# Patient Record
Sex: Male | Born: 1950 | Race: White | Hispanic: No | Marital: Married | Smoking: Former smoker
Health system: Southern US, Community
[De-identification: ages and names within clinical notes are randomized; demographics above are authoritative.]

## PROBLEM LIST (undated history)

## (undated) DIAGNOSIS — I10 Essential (primary) hypertension: Secondary | ICD-10-CM

## (undated) DIAGNOSIS — I341 Nonrheumatic mitral (valve) prolapse: Secondary | ICD-10-CM

## (undated) DIAGNOSIS — I5032 Chronic diastolic (congestive) heart failure: Secondary | ICD-10-CM

## (undated) DIAGNOSIS — Z9889 Other specified postprocedural states: Secondary | ICD-10-CM

## (undated) DIAGNOSIS — I340 Nonrheumatic mitral (valve) insufficiency: Secondary | ICD-10-CM

## (undated) DIAGNOSIS — R011 Cardiac murmur, unspecified: Secondary | ICD-10-CM

## (undated) DIAGNOSIS — M1990 Unspecified osteoarthritis, unspecified site: Secondary | ICD-10-CM

## (undated) HISTORY — DX: Nonrheumatic mitral (valve) prolapse: I34.1

## (undated) HISTORY — DX: Unspecified osteoarthritis, unspecified site: M19.90

## (undated) HISTORY — DX: Essential (primary) hypertension: I10

## (undated) HISTORY — DX: Nonrheumatic mitral (valve) insufficiency: I34.0

## (undated) HISTORY — DX: Cardiac murmur, unspecified: R01.1

## (undated) HISTORY — PX: NEPHRECTOMY: SHX65

---

## 2017-04-11 DIAGNOSIS — I10 Essential (primary) hypertension: Secondary | ICD-10-CM | POA: Diagnosis not present

## 2017-04-11 DIAGNOSIS — I340 Nonrheumatic mitral (valve) insufficiency: Secondary | ICD-10-CM | POA: Diagnosis not present

## 2017-04-11 DIAGNOSIS — I341 Nonrheumatic mitral (valve) prolapse: Secondary | ICD-10-CM | POA: Diagnosis not present

## 2017-04-26 DIAGNOSIS — Z79899 Other long term (current) drug therapy: Secondary | ICD-10-CM | POA: Diagnosis not present

## 2017-04-26 DIAGNOSIS — I10 Essential (primary) hypertension: Secondary | ICD-10-CM | POA: Diagnosis not present

## 2017-05-03 DIAGNOSIS — I341 Nonrheumatic mitral (valve) prolapse: Secondary | ICD-10-CM | POA: Diagnosis not present

## 2017-06-13 DIAGNOSIS — I509 Heart failure, unspecified: Secondary | ICD-10-CM | POA: Diagnosis not present

## 2017-06-13 DIAGNOSIS — I080 Rheumatic disorders of both mitral and aortic valves: Secondary | ICD-10-CM | POA: Diagnosis not present

## 2017-06-13 DIAGNOSIS — Z87891 Personal history of nicotine dependence: Secondary | ICD-10-CM | POA: Diagnosis not present

## 2017-06-13 DIAGNOSIS — R0602 Shortness of breath: Secondary | ICD-10-CM | POA: Diagnosis not present

## 2017-06-13 DIAGNOSIS — I340 Nonrheumatic mitral (valve) insufficiency: Secondary | ICD-10-CM | POA: Diagnosis not present

## 2017-06-13 DIAGNOSIS — I341 Nonrheumatic mitral (valve) prolapse: Secondary | ICD-10-CM | POA: Diagnosis not present

## 2017-06-13 DIAGNOSIS — I110 Hypertensive heart disease with heart failure: Secondary | ICD-10-CM | POA: Diagnosis not present

## 2017-07-03 DIAGNOSIS — Z1159 Encounter for screening for other viral diseases: Secondary | ICD-10-CM | POA: Diagnosis not present

## 2017-07-03 DIAGNOSIS — R748 Abnormal levels of other serum enzymes: Secondary | ICD-10-CM | POA: Diagnosis not present

## 2017-07-08 ENCOUNTER — Encounter: Payer: Self-pay | Admitting: Thoracic Surgery (Cardiothoracic Vascular Surgery)

## 2017-07-08 ENCOUNTER — Other Ambulatory Visit: Payer: Self-pay

## 2017-07-08 ENCOUNTER — Institutional Professional Consult (permissible substitution) (INDEPENDENT_AMBULATORY_CARE_PROVIDER_SITE_OTHER): Payer: PPO | Admitting: Thoracic Surgery (Cardiothoracic Vascular Surgery)

## 2017-07-08 VITALS — BP 148/96 | Resp 16 | Ht 67.0 in | Wt 230.0 lb

## 2017-07-08 DIAGNOSIS — I340 Nonrheumatic mitral (valve) insufficiency: Secondary | ICD-10-CM | POA: Insufficient documentation

## 2017-07-08 DIAGNOSIS — I341 Nonrheumatic mitral (valve) prolapse: Secondary | ICD-10-CM

## 2017-07-08 NOTE — Patient Instructions (Signed)
Continue all previous medications without any changes at this time  

## 2017-07-08 NOTE — Progress Notes (Signed)
[address]       Craig Griffin [address]             [phone]     CARDIOTHORACIC SURGERY CONSULTATION REPORT  Referring Provider is Philemon Kingdom, MD PCP is Philemon Kingdom, MD  Chief Complaint  Patient presents with  . Mitral Valve Prolapse    Surgical eval, ECHO 06/13/17 @ [facility], Cardiac Cath   . Mitral Regurgitation    HPI:  Patient is a 66 year old male with history of hypertension and recently discovered mitral valve prolapse with severe symptomatic primary mitral regurgitation who has been referred for second surgical opinion. The patient states that he has been treated for hypertension for many years. He has remained physically active and without significant limitations until fairly recently. Approximately one year ago he began to experience symptoms of exertional shortness of breath and fatigue. He was noted to have a heart murmur on routine physical exam by his primary care physician and transthoracic echocardiogram revealed mitral valve prolapse with severe mitral regurgitation and normal left ventricular systolic function. The patient was evaluated by Dr. Tomie China and referred to Dr. Silvestre Mesi at [facility] for surgical consultation.  The patient was scheduled for elective outpatient cardiac catheterization and surgery, but the patient was subsequently informed that [facility] did not accept the patient's healthcare insurance. The patient was subsequently referred for elective second opinion consultation.  The patient is married and lives with his wife in [place]. He is originally from [place] and relocated to [place] approximately one year ago following his retirement. 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 This does not require any strenuous activity but it does require a lot of  walking. The patient states that he first began to experience symptoms of exertional shortness of breath more than he year ago when he was still working in [place]. He notices symptoms when the weather is hot. He states that he does not experience shortness of breath with ordinary activities and his physical limitations are not limited to a significant degree. His wife complains that he gets short of breath and is "huffing and puffing" much more easily than he used to.  He denies any history of exertional chest pain or chest tightness. He has not experienced any resting shortness of breath, PND, orthopnea, or lower extremity edema. He reports occasional dizzy spells without syncope.  Past Medical History:  Diagnosis Date  . Heart murmur   . HTN (hypertension)   . Mitral regurgitation due to cusp prolapse   . Mitral valve prolapse   . Osteoarthritis   . Severe mitral regurgitation     Past Surgical History:  Procedure Laterality Date  . NEPHRECTOMY      Family History  Problem Relation Age of Onset  . Melanoma Father   . Liver cancer Mother   . Hypertension Mother   . Brain cancer Brother     Social History   Social History  . Marital status: Married    Spouse name: carole  . Number of children: 1  . Years of education: N/A   Occupational History  . superintendent    Social History Main Topics  . Smoking status: Former Smoker    Years: 10.00    Types: Cigars    Quit date: 12/24/1996  . Smokeless tobacco: Never Used  . Alcohol  use Yes  . Drug use: No  . Sexual activity: Not on file   Other Topics Concern  . Not on file   Social History Narrative  . No narrative on file    Current Outpatient Prescriptions  Medication Sig Dispense Refill  . amoxicillin (AMOXIL) 500 MG capsule Take 2,000 mg by mouth. 4 capsules...1 hr before any dental procedures    . olmesartan (BENICAR) 40 MG tablet Take 40 mg by mouth daily.      No current facility-administered medications  for this visit.     No Known Allergies    Review of Systems:   General:  normal appetite, normal energy, no weight gain, no weight loss, no fever  Cardiac:  no chest pain with exertion, no chest pain at rest, + SOB with exertion, no resting SOB, no PND, no orthopnea, no palpitations, no arrhythmia, no atrial fibrillation, no LE edema, no dizzy spells, no syncope  Respiratory:  + exertional shortness of breath, no home oxygen, no productive cough, no dry cough, no bronchitis, no wheezing, no hemoptysis, no asthma, no pain with inspiration or cough, no sleep apnea, no CPAP at night  GI:   no difficulty swallowing, no reflux, no frequent heartburn, no hiatal hernia, no abdominal pain, no constipation, no diarrhea, no hematochezia, no hematemesis, no melena  GU:   no dysuria,  no frequency, no urinary tract infection, no hematuria, no enlarged prostate, no kidney stones, no kidney disease  Vascular:  no pain suggestive of claudication, no pain in feet, no leg cramps, no varicose veins, no DVT, no non-healing foot ulcer  Neuro:   no stroke, no TIA's, no seizures, no headaches, no temporary blindness one eye,  no slurred speech, no peripheral neuropathy, no chronic pain, no instability of gait, no memory/cognitive dysfunction  Musculoskeletal: no arthritis, no joint swelling, no myalgias, no difficulty walking, normal mobility   Skin:   no rash, no itching, no skin infections, no pressure sores or ulcerations  Psych:   no anxiety, no depression, no nervousness, no unusual recent stress  Eyes:   no blurry vision, no floaters, no recent vision changes, no wears glasses or contacts  ENT:   no hearing loss, no loose or painful teeth, no dentures, last saw dentist 5 years ago  Hematologic:  no easy bruising, no abnormal bleeding, no clotting disorder, no frequent epistaxis  Endocrine:  no diabetes, does not check CBG's at home     Physical Exam:   BP (!) 148/96 (BP Location: Right Arm, Patient  Position: Sitting, Cuff Size: Large)   Resp 16   Ht 5\' 7"  (1.702 m)   Wt 230 lb (104.3 kg)   SpO2 97% Comment: RA  BMI 36.02 kg/m   General:  Mildly obese,  well-appearing  HEENT:  Unremarkable   Neck:   no JVD, no bruits, no adenopathy   Chest:   clear to auscultation, symmetrical breath sounds, no wheezes, no rhonchi   CV:   RRR, prominent grade IV/VI holosystolic murmur   Abdomen:  soft, non-tender, no masses   Extremities:  warm, well-perfused, pulses diminished but palpable, no LE edema  Rectal/GU  Deferred  Neuro:   Grossly non-focal and symmetrical throughout  Skin:   Clean and dry, no rashes, no breakdown   Diagnostic Tests:  TRANSTHORACIC ECHOCARDIOGRAM  Both the images and the report from transthoracic echocardiogram performed 06/13/2017 at [facility] are reviewed. The patient has mitral valve prolapse with a large flail segment involving  the middle scallop of the posterior leaflet and severe mitral regurgitation. Left ventricular size and systolic function appear normal. The left atrium is dilated. Images from parasternal views are suboptimal but the apical 4 chamber and 2 chamber views are good quality and clearly demonstrated functional anatomy of the mitral valve. There is mild aortic insufficiency. There is trivial tricuspid regurgitation. There is normal right ventricular size and function. The tricuspid annulus does not appear dilated.   Impression:  Patient has mitral valve prolapse with stage D severe symptomatic primary mitral regurgitation.  He presents with stable symptoms of exertional shortness of breath consistent with chronic diastolic congestive heart failure, New York Heart Association functional class II. I personally reviewed the patient's recent transthoracic echocardiogram. The patient has last echo mitral valve disease with large flail segment involving the middle scallop of the posterior leaflet and severe mitral regurgitation. There  is a high likelihood that is valve should be repairable area in the absence of significant coronary artery disease the patient appears to be relatively good candidate for minimally invasive approach for surgery. He will need to undergo diagnostic cardiac catheterization prior to surgery.   Plan:  The patient and his wife were counseled at length regarding the indications, risks and potential benefits of mitral valve repair.  The rationale for elective surgery has been explained, including a comparison between surgery and continued medical therapy with close follow-up.  The likelihood of successful and durable valve repair has been discussed with particular reference to the findings of their recent echocardiogram.  Based upon these findings and previous experience, I have quoted them a greater than 95 percent likelihood of successful valve repair.  In the unlikely event that their valve cannot be successfully repaired, we discussed the possibility of replacing the mitral valve using a mechanical prosthesis with the attendant need for long-term anticoagulation versus the alternative of replacing it using a bioprosthetic tissue valve with its potential for late structural valve deterioration and failure, depending upon the patient's longevity.  The patient understands and accepts all potential risks of surgery including but not limited to risk of death, stroke or other neurologic complication, myocardial infarction, congestive heart failure, respiratory failure, renal failure, bleeding requiring transfusion and/or reexploration, arrhythmia, infection or other wound complications, pneumonia, pleural and/or pericardial effusion, pulmonary embolus, aortic dissection or other major vascular complication, or delayed complications related to valve repair or replacement including but not limited to structural valve deterioration and failure, thrombosis, embolization, endocarditis, or paravalvular leak.  Alternative  surgical approaches have been discussed including a comparison between conventional sternotomy and minimally-invasive techniques.  The relative risks and benefits of each have been reviewed as they pertain to the patient's specific circumstances, and all of their questions have been addressed.  Specific risks potentially related to the minimally-invasive approach were discussed at length, including but not limited to risk of conversion to full or partial sternotomy, aortic dissection or other major vascular complication, unilateral acute lung injury or pulmonary edema, phrenic nerve dysfunction or paralysis, rib fracture, chronic pain, lung hernia, or lymphocele. All of their questions have been answered.  We tentatively plan to proceed with minimally invasive mitral valve repair on Wednesday, 07/24/2017. Prior to surgery the patient will undergo left and right heart catheterization.  In addition, he will undergo CT angiography of the aorta and iliac vessels to evaluate the feasibility of peripheral arterial cannulation for surgery. Patient will return to our office for follow-up on 07/22/2017 prior to surgery.   I spent in excess  of 90 minutes during the conduct of this office consultation and >50% of this time involved direct face-to-face encounter with the patient for counseling and/or coordination of their care.   Salvatore Decent. Cornelius Moras, MD 07/08/2017 4:57 PM

## 2017-07-09 ENCOUNTER — Other Ambulatory Visit: Payer: Self-pay

## 2017-07-09 ENCOUNTER — Other Ambulatory Visit: Payer: Self-pay | Admitting: *Deleted

## 2017-07-09 DIAGNOSIS — I7101 Dissection of thoracic aorta: Secondary | ICD-10-CM

## 2017-07-09 DIAGNOSIS — I340 Nonrheumatic mitral (valve) insufficiency: Secondary | ICD-10-CM

## 2017-07-09 DIAGNOSIS — Z01818 Encounter for other preprocedural examination: Secondary | ICD-10-CM

## 2017-07-09 DIAGNOSIS — I712 Thoracic aortic aneurysm, without rupture, unspecified: Secondary | ICD-10-CM

## 2017-07-09 DIAGNOSIS — I71019 Dissection of thoracic aorta, unspecified: Secondary | ICD-10-CM

## 2017-07-09 DIAGNOSIS — I7409 Other arterial embolism and thrombosis of abdominal aorta: Secondary | ICD-10-CM

## 2017-07-09 MED ORDER — GLUTARALDEHYDE 0.625% SOAKING SOLUTION: TOPICAL | Status: DC | PRN

## 2017-07-16 ENCOUNTER — Ambulatory Visit
Admission: RE | Admit: 2017-07-16 | Discharge: 2017-07-16 | Disposition: A | Discharge status: Home / Self Care | Payer: PPO | Source: Ambulatory Visit | Attending: Thoracic Surgery (Cardiothoracic Vascular Surgery) | Admitting: Thoracic Surgery (Cardiothoracic Vascular Surgery)

## 2017-07-16 DIAGNOSIS — I7409 Other arterial embolism and thrombosis of abdominal aorta: Secondary | ICD-10-CM

## 2017-07-16 DIAGNOSIS — I340 Nonrheumatic mitral (valve) insufficiency: Secondary | ICD-10-CM

## 2017-07-16 DIAGNOSIS — I7101 Dissection of thoracic aorta: Secondary | ICD-10-CM

## 2017-07-16 DIAGNOSIS — I712 Thoracic aortic aneurysm, without rupture, unspecified: Secondary | ICD-10-CM

## 2017-07-16 DIAGNOSIS — Z01818 Encounter for other preprocedural examination: Secondary | ICD-10-CM

## 2017-07-16 DIAGNOSIS — I71019 Dissection of thoracic aorta, unspecified: Secondary | ICD-10-CM

## 2017-07-16 DIAGNOSIS — K760 Fatty (change of) liver, not elsewhere classified: Secondary | ICD-10-CM | POA: Diagnosis not present

## 2017-07-16 MED ORDER — IOPAMIDOL (ISOVUE-370) INJECTION 76%
75.0000 mL | Freq: Once | INTRAVENOUS | Status: AC | PRN
  Administered 2017-07-16: 75 mL via INTRAVENOUS

## 2017-07-18 ENCOUNTER — Telehealth: Payer: Self-pay

## 2017-07-18 NOTE — Telephone Encounter (Signed)
Labs received and reviewed.  Cr is within normal limits.  Labs given to MR to scan in.

## 2017-07-18 NOTE — Telephone Encounter (Signed)
[facility], requested Pt labs.  Office to fax recent labs to this office.  Will review.

## 2017-07-18 NOTE — Telephone Encounter (Signed)
Patient [facility] scheduled for:  07/19/2017 @ 1030 Verified arrival time and place:  NT @ 0800-Gave directions to admissions Confirmed AM meds to be taken pre-cath with sip of water: Notified to take ASA 81 mg prior to arrival. 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  No labs in EPIC to review for function.  Will attempt to contact Pt PCP.

## 2017-07-19 ENCOUNTER — Encounter (HOSPITAL_COMMUNITY)
Admission: RE | Disposition: A | Discharge status: Home / Self Care | Payer: Self-pay | Source: Ambulatory Visit | Attending: Cardiology

## 2017-07-19 ENCOUNTER — Ambulatory Visit (HOSPITAL_COMMUNITY)
Admission: RE | Admit: 2017-07-19 | Discharge: 2017-07-19 | Disposition: A | Discharge status: Home / Self Care | Payer: PPO | Source: Ambulatory Visit | Attending: Cardiology | Admitting: Cardiology

## 2017-07-19 DIAGNOSIS — I272 Pulmonary hypertension, unspecified: Secondary | ICD-10-CM | POA: Diagnosis not present

## 2017-07-19 DIAGNOSIS — I341 Nonrheumatic mitral (valve) prolapse: Secondary | ICD-10-CM | POA: Diagnosis present

## 2017-07-19 DIAGNOSIS — I340 Nonrheumatic mitral (valve) insufficiency: Secondary | ICD-10-CM | POA: Diagnosis present

## 2017-07-19 DIAGNOSIS — M1990 Unspecified osteoarthritis, unspecified site: Secondary | ICD-10-CM | POA: Insufficient documentation

## 2017-07-19 DIAGNOSIS — I10 Essential (primary) hypertension: Secondary | ICD-10-CM | POA: Diagnosis not present

## 2017-07-19 DIAGNOSIS — Z87891 Personal history of nicotine dependence: Secondary | ICD-10-CM | POA: Insufficient documentation

## 2017-07-19 DIAGNOSIS — Z905 Acquired absence of kidney: Secondary | ICD-10-CM | POA: Insufficient documentation

## 2017-07-19 HISTORY — PX: RIGHT/LEFT HEART CATH AND CORONARY ANGIOGRAPHY: CATH118266

## 2017-07-19 LAB — CBC
HCT: 41.7 % (ref 39.0–52.0)
HEMOGLOBIN: 14.5 g/dL (ref 13.0–17.0)
MCH: 31.2 pg (ref 26.0–34.0)
MCHC: 34.8 g/dL (ref 30.0–36.0)
MCV: 89.7 fL (ref 78.0–100.0)
PLATELETS: 168 10*3/uL (ref 150–400)
RBC: 4.65 MIL/uL (ref 4.22–5.81)
RDW: 12.5 % (ref 11.5–15.5)
WBC: 6.3 10*3/uL (ref 4.0–10.5)

## 2017-07-19 LAB — POCT I-STAT 3, VENOUS BLOOD GAS (G3P V)
Acid-base deficit: 3 mmol/L — ABNORMAL HIGH (ref 0.0–2.0)
BICARBONATE: 22.6 mmol/L (ref 20.0–28.0)
O2 Saturation: 66 %
PCO2 VEN: 41.1 mmHg — AB (ref 44.0–60.0)
PH VEN: 7.348 (ref 7.250–7.430)
TCO2: 24 mmol/L (ref 0–100)
pO2, Ven: 36 mmHg (ref 32.0–45.0)

## 2017-07-19 LAB — BASIC METABOLIC PANEL
ANION GAP: 6 (ref 5–15)
BUN: 17 mg/dL (ref 6–20)
CHLORIDE: 108 mmol/L (ref 101–111)
CO2: 25 mmol/L (ref 22–32)
Calcium: 8.9 mg/dL (ref 8.9–10.3)
Creatinine, Ser: 1.24 mg/dL (ref 0.61–1.24)
GFR calc Af Amer: >60 mL/min (ref >60)
GFR, EST NON AFRICAN AMERICAN: 58 mL/min — AB (ref >60)
Glucose, Bld: 124 mg/dL — ABNORMAL HIGH (ref 65–99)
POTASSIUM: 4.4 mmol/L (ref 3.5–5.1)
SODIUM: 139 mmol/L (ref 135–145)

## 2017-07-19 LAB — POCT I-STAT 3, ART BLOOD GAS (G3+)
Acid-base deficit: 3 mmol/L — ABNORMAL HIGH (ref 0.0–2.0)
BICARBONATE: 21.9 mmol/L (ref 20.0–28.0)
O2 Saturation: 95 %
PH ART: 7.392 (ref 7.350–7.450)
PO2 ART: 75 mmHg — AB (ref 83.0–108.0)
TCO2: 23 mmol/L (ref 0–100)
pCO2 arterial: 36.1 mmHg (ref 32.0–48.0)

## 2017-07-19 LAB — PROTIME-INR
INR: 0.96
PROTHROMBIN TIME: 12.8 s (ref 11.4–15.2)

## 2017-07-19 SURGERY — RIGHT/LEFT HEART CATH AND CORONARY ANGIOGRAPHY
Anesthesia: LOCAL

## 2017-07-19 MED ORDER — SODIUM CHLORIDE 0.9 % WEIGHT BASED INFUSION: 1.0000 mL/kg/h | INTRAVENOUS | Status: DC

## 2017-07-19 MED ORDER — IOPAMIDOL (ISOVUE-370) INJECTION 76%
INTRAVENOUS | Status: AC
  Filled 2017-07-19: qty 100

## 2017-07-19 MED ORDER — SODIUM CHLORIDE 0.9% FLUSH: 3.0000 mL | INTRAVENOUS | Status: DC | PRN

## 2017-07-19 MED ORDER — LIDOCAINE HCL (PF) 1 % IJ SOLN
INTRAMUSCULAR | Status: DC | PRN
  Administered 2017-07-19 (×2): 2 mL

## 2017-07-19 MED ORDER — SODIUM CHLORIDE 0.9% FLUSH: 3.0000 mL | Freq: Two times a day (BID) | INTRAVENOUS | Status: DC

## 2017-07-19 MED ORDER — IOPAMIDOL (ISOVUE-370) INJECTION 76%
INTRAVENOUS | Status: DC | PRN
  Administered 2017-07-19: 60 mL via INTRA_ARTERIAL

## 2017-07-19 MED ORDER — SODIUM CHLORIDE 0.9 % WEIGHT BASED INFUSION: 1.0000 mL/kg/h | INTRAVENOUS | Status: AC

## 2017-07-19 MED ORDER — ASPIRIN 81 MG PO CHEW: 81.0000 mg | CHEWABLE_TABLET | ORAL | Status: DC

## 2017-07-19 MED ORDER — MIDAZOLAM HCL 2 MG/2ML IJ SOLN
INTRAMUSCULAR | Status: DC | PRN
  Administered 2017-07-19: 1 mg via INTRAVENOUS

## 2017-07-19 MED ORDER — HYDRALAZINE HCL 20 MG/ML IJ SOLN
5.0000 mg | Freq: Once | INTRAMUSCULAR | Status: AC
  Administered 2017-07-19: 5 mg via INTRAVENOUS

## 2017-07-19 MED ORDER — SODIUM CHLORIDE 0.9 % WEIGHT BASED INFUSION
3.0000 mL/kg/h | INTRAVENOUS | Status: AC
  Administered 2017-07-19: 3 mL/kg/h via INTRAVENOUS

## 2017-07-19 MED ORDER — HEPARIN SODIUM (PORCINE) 1000 UNIT/ML IJ SOLN
INTRAMUSCULAR | Status: DC | PRN
  Administered 2017-07-19: 5000 [IU] via INTRAVENOUS

## 2017-07-19 MED ORDER — HEPARIN (PORCINE) IN NACL 2-0.9 UNIT/ML-% IJ SOLN
INTRAMUSCULAR | Status: AC
  Filled 2017-07-19: qty 1000

## 2017-07-19 MED ORDER — HEPARIN SODIUM (PORCINE) 1000 UNIT/ML IJ SOLN
INTRAMUSCULAR | Status: AC
  Filled 2017-07-19: qty 1

## 2017-07-19 MED ORDER — FENTANYL CITRATE (PF) 100 MCG/2ML IJ SOLN
INTRAMUSCULAR | Status: DC | PRN
  Administered 2017-07-19: 25 ug via INTRAVENOUS

## 2017-07-19 MED ORDER — VERAPAMIL HCL 2.5 MG/ML IV SOLN
INTRAVENOUS | Status: AC
  Filled 2017-07-19: qty 2

## 2017-07-19 MED ORDER — SODIUM CHLORIDE 0.9 % IV SOLN: 250.0000 mL | INTRAVENOUS | Status: DC | PRN

## 2017-07-19 MED ORDER — HYDRALAZINE HCL 20 MG/ML IJ SOLN
INTRAMUSCULAR | Status: AC
  Filled 2017-07-19: qty 1

## 2017-07-19 MED ORDER — HEPARIN (PORCINE) IN NACL 2-0.9 UNIT/ML-% IJ SOLN
INTRAMUSCULAR | Status: AC | PRN
  Administered 2017-07-19: 1000 mL

## 2017-07-19 MED ORDER — LIDOCAINE HCL 1 % IJ SOLN
INTRAMUSCULAR | Status: AC
  Filled 2017-07-19: qty 20

## 2017-07-19 MED ORDER — MIDAZOLAM HCL 2 MG/2ML IJ SOLN
INTRAMUSCULAR | Status: AC
  Filled 2017-07-19: qty 2

## 2017-07-19 MED ORDER — VERAPAMIL HCL 2.5 MG/ML IV SOLN
INTRAVENOUS | Status: DC | PRN
  Administered 2017-07-19: 10:00:00 via INTRA_ARTERIAL

## 2017-07-19 MED ORDER — FENTANYL CITRATE (PF) 100 MCG/2ML IJ SOLN
INTRAMUSCULAR | Status: AC
  Filled 2017-07-19: qty 2

## 2017-07-19 SURGICAL SUPPLY — 14 items
CATH 5FR JL3.5 JR4 ANG PIG MP (CATHETERS) ×2 IMPLANT
CATH BALLN WEDGE 5F 110CM (CATHETERS) ×2 IMPLANT
COVER PRB 48X5XTLSCP FOLD TPE (BAG) ×1 IMPLANT
COVER PROBE 5X48 (BAG) ×1
DEVICE RAD COMP TR BAND LRG (VASCULAR PRODUCTS) ×2 IMPLANT
GLIDESHEATH SLEND SS 6F .021 (SHEATH) ×2 IMPLANT
GUIDEWIRE INQWIRE 1.5J.035X260 (WIRE) ×1 IMPLANT
INQWIRE 1.5J .035X260CM (WIRE) ×2
KIT HEART LEFT (KITS) ×2 IMPLANT
PACK CARDIAC CATHETERIZATION (CUSTOM PROCEDURE TRAY) ×2 IMPLANT
SHEATH GLIDE SLENDER 4/5FR (SHEATH) ×2 IMPLANT
SYR MEDRAD MARK V 150ML (SYRINGE) ×2 IMPLANT
TRANSDUCER W/STOPCOCK (MISCELLANEOUS) ×2 IMPLANT
TUBING CIL FLEX 10 FLL-RA (TUBING) ×2 IMPLANT

## 2017-07-19 NOTE — Discharge Instructions (Signed)

## 2017-07-19 NOTE — H&P (Signed)
Physician History and Physical    Patient ID: Craig Griffin MRN: [id number] DOB/AGE: [birth year] 66 y.o. Admit date: 07/19/2017  Primary Care Physician: Philemon KingdomProchnau, Caroline, MD Primary Cardiologist Dr. Tomie Chinaevankar  HPI: 66 yo WM with history of HTN and MVP with severe MR presents for cardiac cath in preparation for MV repair. He reports a one year history of exertional dyspnea. Some fatigue. No chest pain, edema, orthopnea, PND. Found to have MV prolapse with severe MR secondary to flail middle scallop of the posterior leaflet. Patient seen by Dr. Cornelius Moraswen for consideration of MV repair and set up for cardiac cath today. Patient is generally in good health with no prior cardiac history.  Review of systems complete and found to be negative unless listed above  Past Medical History:  Diagnosis Date  . Heart murmur   . HTN (hypertension)   . Mitral regurgitation due to cusp prolapse   . Mitral valve prolapse   . Osteoarthritis   . Severe mitral regurgitation     Family History  Problem Relation Age of Onset  . Melanoma Father   . Liver cancer Mother   . Hypertension Mother   . Brain cancer Brother     Social History   Social History  . Marital status: Married    Spouse name: carole  . Number of children: 1  . Years of education: N/A   Occupational History  . superintendent    Social History Main Topics  . Smoking status: Former Smoker    Years: 10.00    Types: Cigars    Quit date: 12/24/1996  . Smokeless tobacco: Never Used  . Alcohol use Yes  . Drug use: No  . Sexual activity: Not on file   Other Topics Concern  . Not on file   Social History Narrative  . No narrative on file    Past Surgical History:  Procedure Laterality Date  . NEPHRECTOMY       Facility-Administered Medications Prior to Admission  Medication Dose Route Frequency Provider Last Rate Last Dose  . glutaraldehyde 0.625% soaking solution   Topical PRN Purcell Nailswen, Clarence H, MD       Prescriptions  Prior to Admission  Medication Sig Dispense Refill Last Dose  . amoxicillin (AMOXIL) 500 MG capsule Take 2,000 mg by mouth. 4 capsules...1 hr before any dental procedures   never  . olmesartan (BENICAR) 40 MG tablet Take 40 mg by mouth daily.    07/19/2017 at 0700    Physical Exam: Blood pressure (!) 170/106, pulse 89, temperature 97.7 F (36.5 C), height 5\' 7"  (1.702 m), weight 230 lb (104.3 kg), SpO2 97 %.  Current Weight  07/19/17 230 lb (104.3 kg)  07/08/17 230 lb (104.3 kg)    GENERAL:  Well appearing HEENT:  PERRL, EOMI, sclera are clear. Oropharynx is clear. NECK:  No jugular venous distention, carotid upstroke brisk and symmetric, no bruits, no thyromegaly or adenopathy LUNGS:  Clear to auscultation bilaterally CHEST:  Unremarkable HEART:  RRR,  PMI not displaced or sustained,S1 and S2 within normal limits, no S3, no S4: there is a blowing gr 3-4/6 holosystolic murmur at the apex radiating to left axilla.  ABD:  Soft, nontender. BS +, no masses or bruits. No hepatomegaly, no splenomegaly EXT:  2 + pulses throughout, no edema, no cyanosis no clubbing SKIN:  Warm and dry.  No rashes NEURO:  Alert and oriented x 3. Cranial nerves II through XII intact. PSYCH:  Cognitively intact    Labs:  No results found for: WBC, HGB, HCT, MCV, PLT No results for input(s): NA, K, CL, CO2, BUN, CREATININE, CALCIUM, PROT, BILITOT, ALKPHOS, ALT, AST, GLUCOSE in the last 168 hours.  Invalid input(s): LABALBU No results found for: CKMB, CKMBINDEX, TROPONINI No results found for: CHOL No results found for: HDL No results found for: LDLCALC No results found for: TRIG No results found for: CHOLHDL No results found for: LDLDIRECT  No results found for: PROBNP No results found for: TSH No results found for: HGBA1C  Radiology: No results found.  EKG: NSR rate 81. Normal Ecg. I have personally reviewed and interpreted this study.   ASSESSMENT AND PLAN:  1. MVP with severe MR secondary to  flail middle scallop of the posterior leaflet. Patient is symptomatic.  2. HTN  Plan: proceed with right and left heart cath to define hemodynamics and evaluate coronary anatomy. The procedure and risks were reviewed including but not limited to death, myocardial infarction, stroke, arrythmias, bleeding, transfusion, emergency surgery, dye allergy, or renal dysfunction. The patient voices understanding and is agreeable to proceed..  Signed: Estes Lehner [place], MDFACC  07/19/2017, 8:32 AM

## 2017-07-19 NOTE — Pre-Procedure Instructions (Signed)
Craig Griffin  07/19/2017      [facility] [place] [place] - [place], [place] - [address] [place] [address] [place] [address] Phone: [phone] Fax: [phone]    Your procedure is scheduled on Wednesday August 1.  [facility] at 6:30 A.M.  Call this number if you have problems the morning of surgery:  [phone]   Remember:  Do not eat food or drink liquids after midnight.  Take these medicines the morning of surgery with A SIP OF WATER NONE  7 days prior to surgery STOP taking any Aspirin, Aleve, Naproxen, Ibuprofen, Motrin, Advil, Goody's, BC's, all herbal medications, fish oil, and all vitamins    Do not wear jewelry  Do not wear lotions, powders, or colognes, or deoderant.   Men may shave face and neck.  Do not bring valuables to the hospital.  [facility] is not responsible for any belongings or valuables.  Contacts, dentures or bridgework may not be worn into surgery.  Leave your suitcase in the car.  After surgery it may be brought to your room.  For patients admitted to the hospital, discharge time will be determined by your treatment team.  Patients discharged the day of surgery will not be allowed to drive home.   Special instructions:    [facility]- Preparing For Surgery  Before surgery, you can play an important role. Because skin is not sterile, your skin needs to be as free of germs as possible. You can reduce the number of germs on your skin by washing with CHG (chlorahexidine gluconate) Soap before surgery.  CHG is an antiseptic cleaner which kills germs and bonds with the skin to continue killing germs even after washing.  Please do not use if you have an allergy to CHG or antibacterial soaps. If your skin becomes reddened/irritated stop using the CHG.  Do not shave (including legs and underarms) for at least 48 hours prior to first CHG shower. It is OK to shave your face.  Please follow these  instructions carefully.   1. Shower the NIGHT BEFORE SURGERY and the MORNING OF SURGERY with CHG.   2. If you chose to wash your hair, wash your hair first as usual with your normal shampoo.  3. After you shampoo, rinse your hair and body thoroughly to remove the shampoo.  4. Use CHG as you would any other liquid soap. You can apply CHG directly to the skin and wash gently with a scrungie or a clean washcloth.   5. Apply the CHG Soap to your body ONLY FROM THE NECK DOWN.  Do not use on open wounds or open sores. Avoid contact with your eyes, ears, mouth and genitals (private parts). Wash genitals (private parts) with your normal soap.  6. Wash thoroughly, paying special attention to the area where your surgery will be performed.  7. Thoroughly rinse your body with warm water from the neck down.  8. DO NOT shower/wash with your normal soap after using and rinsing off the CHG Soap.  9. Pat yourself dry with a CLEAN TOWEL.   10. Wear CLEAN PAJAMAS   11. Place CLEAN SHEETS on your bed the night of your first shower and DO NOT SLEEP WITH PETS.    Day of Surgery: Do not apply any deodorants/lotions. Please wear clean clothes to the hospital/surgery center.      Please read over the following fact sheets that you  were given. Coughing and Deep Breathing and MRSA Information

## 2017-07-19 NOTE — Interval H&P Note (Signed)
History and Physical Interval Note:  07/19/2017 9:41 AM  Craig Griffin  has presented today for surgery, with the diagnosis of mitrial valve disease, pre-surgical clearance  The various methods of treatment have been discussed with the patient and family. After consideration of risks, benefits and other options for treatment, the patient has consented to  Procedure(s): Right/Left Heart Cath and Coronary Angiography (N/A) as a surgical intervention .  The patient's history has been reviewed, patient examined, no change in status, stable for surgery.  I have reviewed the patient's chart and labs.  Questions were answered to the patient's satisfaction.     Theron Aristaeter [facility] HealthJordanMD,FACC 07/19/2017 9:41 AM

## 2017-07-19 NOTE — Progress Notes (Signed)
Site area: right brachial vein  Site Prior to Removal:  Level 0 Pressure Applied For:20 min Manual:   yes Patient Status During Pull:  vss Post Pull Site:  Level 0 Post Pull Instructions Given: yes   Post Pull Pulses Present: yes  Dressing Applied:  Gauze tega drem and pressure wrap Bedrest begins @ na Comments:

## 2017-07-19 NOTE — Progress Notes (Signed)
HTN continues, Lyndsey PA for cardiology was paged/ orders followed.

## 2017-07-22 ENCOUNTER — Encounter (HOSPITAL_COMMUNITY)
Admission: RE | Admit: 2017-07-22 | Discharge: 2017-07-22 | Disposition: A | Discharge status: Home / Self Care | Payer: PPO | Source: Ambulatory Visit | Attending: Thoracic Surgery (Cardiothoracic Vascular Surgery) | Admitting: Thoracic Surgery (Cardiothoracic Vascular Surgery)

## 2017-07-22 ENCOUNTER — Ambulatory Visit (HOSPITAL_BASED_OUTPATIENT_CLINIC_OR_DEPARTMENT_OTHER)
Admission: RE | Admit: 2017-07-22 | Discharge: 2017-07-22 | Disposition: A | Discharge status: Home / Self Care | Payer: PPO | Source: Ambulatory Visit | Attending: Thoracic Surgery (Cardiothoracic Vascular Surgery) | Admitting: Thoracic Surgery (Cardiothoracic Vascular Surgery)

## 2017-07-22 ENCOUNTER — Ambulatory Visit (HOSPITAL_COMMUNITY)
Admission: RE | Admit: 2017-07-22 | Discharge: 2017-07-22 | Disposition: A | Discharge status: Home / Self Care | Payer: PPO | Source: Ambulatory Visit | Attending: Thoracic Surgery (Cardiothoracic Vascular Surgery) | Admitting: Thoracic Surgery (Cardiothoracic Vascular Surgery)

## 2017-07-22 ENCOUNTER — Encounter (HOSPITAL_COMMUNITY): Payer: Self-pay | Admitting: Cardiology

## 2017-07-22 DIAGNOSIS — D62 Acute posthemorrhagic anemia: Secondary | ICD-10-CM | POA: Diagnosis not present

## 2017-07-22 DIAGNOSIS — M1990 Unspecified osteoarthritis, unspecified site: Secondary | ICD-10-CM | POA: Diagnosis present

## 2017-07-22 DIAGNOSIS — Z6837 Body mass index (BMI) 37.0-37.9, adult: Secondary | ICD-10-CM | POA: Diagnosis not present

## 2017-07-22 DIAGNOSIS — I340 Nonrheumatic mitral (valve) insufficiency: Secondary | ICD-10-CM

## 2017-07-22 DIAGNOSIS — D6959 Other secondary thrombocytopenia: Secondary | ICD-10-CM | POA: Diagnosis not present

## 2017-07-22 DIAGNOSIS — I110 Hypertensive heart disease with heart failure: Secondary | ICD-10-CM | POA: Diagnosis not present

## 2017-07-22 DIAGNOSIS — Z4682 Encounter for fitting and adjustment of non-vascular catheter: Secondary | ICD-10-CM | POA: Diagnosis not present

## 2017-07-22 DIAGNOSIS — Z01818 Encounter for other preprocedural examination: Secondary | ICD-10-CM

## 2017-07-22 DIAGNOSIS — Z87891 Personal history of nicotine dependence: Secondary | ICD-10-CM

## 2017-07-22 DIAGNOSIS — Z8249 Family history of ischemic heart disease and other diseases of the circulatory system: Secondary | ICD-10-CM | POA: Diagnosis not present

## 2017-07-22 DIAGNOSIS — Z808 Family history of malignant neoplasm of other organs or systems: Secondary | ICD-10-CM | POA: Diagnosis not present

## 2017-07-22 DIAGNOSIS — I080 Rheumatic disorders of both mitral and aortic valves: Secondary | ICD-10-CM | POA: Diagnosis not present

## 2017-07-22 DIAGNOSIS — Z800 Family history of malignant neoplasm of digestive organs: Secondary | ICD-10-CM | POA: Diagnosis not present

## 2017-07-22 DIAGNOSIS — I10 Essential (primary) hypertension: Secondary | ICD-10-CM

## 2017-07-22 DIAGNOSIS — Z452 Encounter for adjustment and management of vascular access device: Secondary | ICD-10-CM | POA: Diagnosis not present

## 2017-07-22 DIAGNOSIS — I351 Nonrheumatic aortic (valve) insufficiency: Secondary | ICD-10-CM | POA: Diagnosis not present

## 2017-07-22 DIAGNOSIS — I341 Nonrheumatic mitral (valve) prolapse: Secondary | ICD-10-CM | POA: Diagnosis not present

## 2017-07-22 DIAGNOSIS — Z905 Acquired absence of kidney: Secondary | ICD-10-CM | POA: Diagnosis not present

## 2017-07-22 DIAGNOSIS — R079 Chest pain, unspecified: Secondary | ICD-10-CM | POA: Diagnosis not present

## 2017-07-22 DIAGNOSIS — I272 Pulmonary hypertension, unspecified: Secondary | ICD-10-CM | POA: Diagnosis not present

## 2017-07-22 DIAGNOSIS — J449 Chronic obstructive pulmonary disease, unspecified: Secondary | ICD-10-CM | POA: Diagnosis not present

## 2017-07-22 DIAGNOSIS — I5032 Chronic diastolic (congestive) heart failure: Secondary | ICD-10-CM | POA: Diagnosis not present

## 2017-07-22 DIAGNOSIS — J9811 Atelectasis: Secondary | ICD-10-CM | POA: Diagnosis not present

## 2017-07-22 LAB — URINALYSIS, ROUTINE W REFLEX MICROSCOPIC
Bilirubin Urine: NEGATIVE
GLUCOSE, UA: NEGATIVE mg/dL
HGB URINE DIPSTICK: NEGATIVE
Ketones, ur: NEGATIVE mg/dL
LEUKOCYTES UA: NEGATIVE
Nitrite: NEGATIVE
PH: 5 (ref 5.0–8.0)
PROTEIN: NEGATIVE mg/dL
Specific Gravity, Urine: 1.018 (ref 1.005–1.030)

## 2017-07-22 LAB — PULMONARY FUNCTION TEST
DL/VA % PRED: 88 %
DL/VA: 3.92 ml/min/mmHg/L
DLCO unc % pred: 75 %
DLCO unc: 21.48 ml/min/mmHg
FEF 25-75 Post: 3.5 L/sec
FEF 25-75 Pre: 2.58 L/sec
FEF2575-%Change-Post: 35 %
FEF2575-%Pred-Post: 149 %
FEF2575-%Pred-Pre: 110 %
FEV1-%Change-Post: 5 %
FEV1-%PRED-PRE: 92 %
FEV1-%Pred-Post: 97 %
FEV1-POST: 2.9 L
FEV1-Pre: 2.75 L
FEV1FVC-%CHANGE-POST: 4 %
FEV1FVC-%Pred-Pre: 108 %
FEV6-%Change-Post: 1 %
FEV6-%PRED-PRE: 90 %
FEV6-%Pred-Post: 91 %
FEV6-PRE: 3.44 L
FEV6-Post: 3.49 L
FEV6FVC-%PRED-POST: 106 %
FEV6FVC-%PRED-PRE: 106 %
FVC-%Change-Post: 1 %
FVC-%PRED-PRE: 85 %
FVC-%Pred-Post: 86 %
FVC-POST: 3.49 L
FVC-PRE: 3.44 L
POST FEV6/FVC RATIO: 100 %
PRE FEV6/FVC RATIO: 100 %
Post FEV1/FVC ratio: 83 %
Pre FEV1/FVC ratio: 80 %
RV % PRED: 72 %
RV: 1.61 L
TLC % PRED: 80 %
TLC: 5.18 L

## 2017-07-22 LAB — VAS US DOPPLER PRE CABG
LEFT ECA DIAS: -9 cm/s
LEFT VERTEBRAL DIAS: 21 cm/s
LICAPDIAS: -22 cm/s
Left CCA dist dias: -17 cm/s
Left CCA dist sys: -84 cm/s
Left CCA prox dias: 26 cm/s
Left CCA prox sys: 101 cm/s
Left ICA dist dias: -29 cm/s
Left ICA dist sys: -79 cm/s
Left ICA prox sys: -58 cm/s
RCCAPDIAS: 18 cm/s
RCCAPSYS: 106 cm/s
RIGHT ECA DIAS: -16 cm/s
RIGHT VERTEBRAL DIAS: 22 cm/s
Right cca dist sys: -76 cm/s

## 2017-07-22 LAB — HEPATIC FUNCTION PANEL
ALBUMIN: 4.2 g/dL (ref 3.5–5.0)
ALK PHOS: 56 U/L (ref 38–126)
ALT: 27 U/L (ref 17–63)
AST: 30 U/L (ref 15–41)
BILIRUBIN DIRECT: 0.2 mg/dL (ref 0.1–0.5)
BILIRUBIN TOTAL: 1 mg/dL (ref 0.3–1.2)
Indirect Bilirubin: 0.8 mg/dL (ref 0.3–0.9)
Total Protein: 7 g/dL (ref 6.5–8.1)

## 2017-07-22 LAB — COMPREHENSIVE METABOLIC PANEL
ALBUMIN: 4.2 g/dL (ref 3.5–5.0)
ALK PHOS: 55 U/L (ref 38–126)
ALT: 29 U/L (ref 17–63)
ANION GAP: 7 (ref 5–15)
AST: 29 U/L (ref 15–41)
BUN: 19 mg/dL (ref 6–20)
CALCIUM: 9.2 mg/dL (ref 8.9–10.3)
CO2: 25 mmol/L (ref 22–32)
Chloride: 106 mmol/L (ref 101–111)
Creatinine, Ser: 1.19 mg/dL (ref 0.61–1.24)
GFR calc Af Amer: >60 mL/min (ref >60)
GFR calc non Af Amer: >60 mL/min (ref >60)
Glucose, Bld: 89 mg/dL (ref 65–99)
Potassium: 4.1 mmol/L (ref 3.5–5.1)
SODIUM: 138 mmol/L (ref 135–145)
Total Bilirubin: 0.9 mg/dL (ref 0.3–1.2)
Total Protein: 7.1 g/dL (ref 6.5–8.1)

## 2017-07-22 LAB — BLOOD GAS, ARTERIAL
Acid-base deficit: 2.9 mmol/L — ABNORMAL HIGH (ref 0.0–2.0)
Bicarbonate: 21 mmol/L (ref 20.0–28.0)
Drawn by: 421801
FIO2: 0.21
O2 SAT: 95.7 %
PATIENT TEMPERATURE: 98.6
pCO2 arterial: 34.2 mmHg (ref 32.0–48.0)
pH, Arterial: 7.406 (ref 7.350–7.450)
pO2, Arterial: 78.4 mmHg — ABNORMAL LOW (ref 83.0–108.0)

## 2017-07-22 LAB — TYPE AND SCREEN
ABO/RH(D): O POS
ANTIBODY SCREEN: NEGATIVE

## 2017-07-22 LAB — APTT: aPTT: 41 seconds — ABNORMAL HIGH (ref 24–36)

## 2017-07-22 LAB — CBC
HCT: 42.3 % (ref 39.0–52.0)
HEMOGLOBIN: 14.5 g/dL (ref 13.0–17.0)
MCH: 31.1 pg (ref 26.0–34.0)
MCHC: 34.3 g/dL (ref 30.0–36.0)
MCV: 90.8 fL (ref 78.0–100.0)
Platelets: 177 10*3/uL (ref 150–400)
RBC: 4.66 MIL/uL (ref 4.22–5.81)
RDW: 12.9 % (ref 11.5–15.5)
WBC: 6 10*3/uL (ref 4.0–10.5)

## 2017-07-22 LAB — SURGICAL PCR SCREEN
MRSA, PCR: NEGATIVE
Staphylococcus aureus: NEGATIVE

## 2017-07-22 LAB — PROTIME-INR
INR: 0.92
Prothrombin Time: 12.3 seconds (ref 11.4–15.2)

## 2017-07-22 LAB — ABO/RH: ABO/RH(D): O POS

## 2017-07-22 MED ORDER — ALBUTEROL SULFATE (2.5 MG/3ML) 0.083% IN NEBU
2.5000 mg | INHALATION_SOLUTION | Freq: Once | RESPIRATORY_TRACT | Status: AC
  Administered 2017-07-22: 2.5 mg via RESPIRATORY_TRACT

## 2017-07-22 NOTE — Progress Notes (Signed)
Pre-op Cardiac Surgery  Carotid Findings:  Bilateral: No significant (1-39%) ICA stenosis. Antegrade vertebral flow.    Upper Extremity Right Left  Brachial Pressures 146 152  Radial Waveforms Tri Tri  Ulnar Waveforms Tri Tri  Palmar Arch (Allen's Test) Normal  Normal    Farrel DemarkJill Eunice, RDMS, RVT 07/22/2017

## 2017-07-22 NOTE — Progress Notes (Signed)
   07/22/17 1508  OBSTRUCTIVE SLEEP APNEA  Have you ever been diagnosed with sleep apnea through a sleep study? No  Do you snore loudly (loud enough to be heard through closed doors)?  0  Do you often feel tired, fatigued, or sleepy during the daytime (such as falling asleep during driving or talking to someone)? 0  Has anyone observed you stop breathing during your sleep? 0  Do you have, or are you being treated for high blood pressure? 1  BMI more than 35 kg/m2? 1  Age > 50 (1-yes) 1  Neck circumference greater than:Male 16 inches or larger, Male 17inches or larger? 1  Male Gender (Yes=1) 1  Obstructive Sleep Apnea Score 5  Score 5 or greater  Results sent to PCP

## 2017-07-22 NOTE — H&P (Signed)
[address]       [place] [address]             [phone]          CARDIOTHORACIC SURGERY HISTORY AND PHYSICAL EXAM   Referring Provider is Philemon Kingdom, MD PCP is Philemon Kingdom, MD      Chief Complaint  Patient presents with  . Mitral Valve Prolapse    Surgical eval, ECHO 06/13/17 @ [facility], Cardiac Cath   . Mitral Regurgitation    HPI:  Patient is a 66 year old male with history of hypertension and recently discovered mitral valve prolapse with severe symptomatic primary mitral regurgitation who has been referred for second surgical opinion. The patient states that he has been treated for hypertension for many years. He has remained physically active and without significant limitations until fairly recently. Approximately one year ago he began to experience symptoms of exertional shortness of breath and fatigue. He was noted to have a heart murmur on routine physical exam by his primary care physician and transthoracic echocardiogram revealed mitral valve prolapse with severe mitral regurgitation and normal left ventricular systolic function. The patient was evaluated by Dr. Tomie China and referred to Dr. Silvestre Mesi at [facility] for surgical consultation.  The patient was scheduled for elective outpatient cardiac catheterization and surgery, but the patient was subsequently informed that [facility] did not accept the patient's healthcare insurance. The patient was subsequently referred for elective second opinion consultation.  The patient is married and lives with his wife in [place]. He is originally from [place] and relocated to [place] approximately one year ago following his retirement. 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 This does not require any strenuous activity but it  does require a lot of walking. The patient states that he first began to experience symptoms of exertional shortness of breath more than he year ago when he was still working in [place]. He notices symptoms when the weather is hot. He states that he does not experience shortness of breath with ordinary activities and his physical limitations are not limited to a significant degree. His wife complains that he gets short of breath and is "huffing and puffing" much more easily than he used to.  He denies any history of exertional chest pain or chest tightness. He has not experienced any resting shortness of breath, PND, orthopnea, or lower extremity edema. He reports occasional dizzy spells without syncope.    Past Medical History:  Diagnosis Date  . Heart murmur   . HTN (hypertension)   . Mitral regurgitation due to cusp prolapse   . Mitral valve prolapse   . Osteoarthritis   . Severe mitral regurgitation     Past Surgical History:  Procedure Laterality Date  . NEPHRECTOMY     right kidney   . RIGHT/LEFT HEART CATH AND CORONARY ANGIOGRAPHY N/A 07/19/2017   Procedure: Right/Left Heart Cath and Coronary Angiography;  Surgeon: Swaziland, Peter M, MD;  Location: [facility] INVASIVE CV LAB;  Service: Cardiovascular;  Laterality: N/A;    Family History  Problem Relation Age of Onset  . Melanoma Father   . Liver cancer Mother   . Hypertension Mother   . Brain cancer Brother     Social History Social History  Substance Use Topics  . Smoking status: Former Smoker    Years: 10.00  Types: Cigars    Quit date: 12/24/1996  . Smokeless tobacco: Never Used  . Alcohol use Yes     Comment: 5 drinks per week, martini     Prior to Admission medications   Medication Sig Start Date End Date Taking? Authorizing Provider  amoxicillin (AMOXIL) 500 MG capsule Take 2,000 mg by mouth. 4 capsules...1 hr before any dental procedures 04/11/17  Yes [provider]  olmesartan (BENICAR) 40 MG tablet Take  40 mg by mouth daily.    Yes [provider]    No Known Allergies   Review of Systems:              General:                      normal appetite, normal energy, no weight gain, no weight loss, no fever             Cardiac:                       no chest pain with exertion, no chest pain at rest, + SOB with exertion, no resting SOB, no PND, no orthopnea, no palpitations, no arrhythmia, no atrial fibrillation, no LE edema, no dizzy spells, no syncope             Respiratory:                 + exertional shortness of breath, no home oxygen, no productive cough, no dry cough, no bronchitis, no wheezing, no hemoptysis, no asthma, no pain with inspiration or cough, no sleep apnea, no CPAP at night             GI:                               no difficulty swallowing, no reflux, no frequent heartburn, no hiatal hernia, no abdominal pain, no constipation, no diarrhea, no hematochezia, no hematemesis, no melena             GU:                              no dysuria,  no frequency, no urinary tract infection, no hematuria, no enlarged prostate, no kidney stones, no kidney disease             Vascular:                     no pain suggestive of claudication, no pain in feet, no leg cramps, no varicose veins, no DVT, no non-healing foot ulcer             Neuro:                         no stroke, no TIA's, no seizures, no headaches, no temporary blindness one eye,  no slurred speech, no peripheral neuropathy, no chronic pain, no instability of gait, no memory/cognitive dysfunction             Musculoskeletal:         no arthritis, no joint swelling, no myalgias, no difficulty walking, normal mobility              Skin:  no rash, no itching, no skin infections, no pressure sores or ulcerations             Psych:                         no anxiety, no depression, no nervousness, no unusual recent stress             Eyes:                           no blurry vision, no  floaters, no recent vision changes, no wears glasses or contacts             ENT:                            no hearing loss, no loose or painful teeth, no dentures, last saw dentist 5 years ago             Hematologic:               no easy bruising, no abnormal bleeding, no clotting disorder, no frequent epistaxis             Endocrine:                   no diabetes, does not check CBG's at home                           Physical Exam:              BP (!) 148/96 (BP Location: Right Arm, Patient Position: Sitting, Cuff Size: Large)   Resp 16   Ht 5\' 7"  (1.702 m)   Wt 230 lb (104.3 kg)   SpO2 97% Comment: RA  BMI 36.02 kg/m              General:                      Mildly obese,  well-appearing             HEENT:                       Unremarkable              Neck:                           no JVD, no bruits, no adenopathy              Chest:                          clear to auscultation, symmetrical breath sounds, no wheezes, no rhonchi              CV:                              RRR, prominent grade IV/VI holosystolic murmur              Abdomen:                    soft, non-tender, no masses              Extremities:  warm, well-perfused, pulses diminished but palpable, no LE edema             Rectal/GU                   Deferred             Neuro:                         Grossly non-focal and symmetrical throughout             Skin:                            Clean and dry, no rashes, no breakdown   Diagnostic Tests:  TRANSTHORACIC ECHOCARDIOGRAM  Both the images and the report from transthoracic echocardiogram performed 06/13/2017 at [facility] are reviewed. The patient has mitral valve prolapse with a large flail segment involving the middle scallop of the posterior leaflet and severe mitral regurgitation. Left ventricular size and systolic function appear normal. The left atrium is dilated. Images from parasternal views are  suboptimal but the apical 4 chamber and 2 chamber views are good quality and clearly demonstrated functional anatomy of the mitral valve. There is mild aortic insufficiency. There is trivial tricuspid regurgitation. There is normal right ventricular size and function. The tricuspid annulus does not appear dilated.    Impression:  Patient has mitral valve prolapse with stage D severe symptomatic primary mitral regurgitation.  He presents with stable symptoms of exertional shortness of breath consistent with chronic diastolic congestive heart failure, [place] Heart Association functional class II. I personally reviewed the patient's recent transthoracic echocardiogram. The patient has last echo mitral valve disease with large flail segment involving the middle scallop of the posterior leaflet and severe mitral regurgitation. There is a high likelihood that is valve should be repairable area in the absence of significant coronary artery disease the patient appears to be relatively good candidate for minimally invasive approach for surgery. He will need to undergo diagnostic cardiac catheterization prior to surgery.   Plan:  The patient and his wife were counseled at length regarding the indications, risks and potential benefits of mitral valve repair.  The rationale for elective surgery has been explained, including a comparison between surgery and continued medical therapy with close follow-up.  The likelihood of successful and durable valve repair has been discussed with particular reference to the findings of their recent echocardiogram.  Based upon these findings and previous experience, I have quoted them a greater than 95 percent likelihood of successful valve repair.  In the unlikely event that their valve cannot be successfully repaired, we discussed the possibility of replacing the mitral valve using a mechanical prosthesis with the attendant need for long-term anticoagulation versus the  alternative of replacing it using a bioprosthetic tissue valve with its potential for late structural valve deterioration and failure, depending upon the patient's longevity.  The patient understands and accepts all potential risks of surgery including but not limited to risk of death, stroke or other neurologic complication, myocardial infarction, congestive heart failure, respiratory failure, renal failure, bleeding requiring transfusion and/or reexploration, arrhythmia, infection or other wound complications, pneumonia, pleural and/or pericardial effusion, pulmonary embolus, aortic dissection or other major vascular complication, or delayed complications related to valve repair or replacement including but not limited to structural valve deterioration and failure, thrombosis, embolization, endocarditis, or paravalvular leak.  Alternative surgical approaches  have been discussed including a comparison between conventional sternotomy and minimally-invasive techniques.  The relative risks and benefits of each have been reviewed as they pertain to the patient's specific circumstances, and all of their questions have been addressed.  Specific risks potentially related to the minimally-invasive approach were discussed at length, including but not limited to risk of conversion to full or partial sternotomy, aortic dissection or other major vascular complication, unilateral acute lung injury or pulmonary edema, phrenic nerve dysfunction or paralysis, rib fracture, chronic pain, lung hernia, or lymphocele. All of their questions have been answered.  We tentatively plan to proceed with minimally invasive mitral valve repair on Wednesday, 07/24/2017. Prior to surgery the patient will undergo left and right heart catheterization.  In addition, he will undergo CT angiography of the aorta and iliac vessels to evaluate the feasibility of peripheral arterial cannulation for surgery. Patient will return to our office for  follow-up on 07/22/2017 prior to surgery.   I spent in excess of 90 minutes during the conduct of this office consultation and >50% of this time involved direct face-to-face encounter with the patient for counseling and/or coordination of their care.   Salvatore Decentlarence H. Cornelius Moraswen, MD 07/08/2017 4:57 PM    CT ANGIOGRAPHY CHEST, ABDOMEN AND PELVIS  TECHNIQUE: Multidetector CT imaging through the chest, abdomen and pelvis was performed using the standard protocol during bolus administration of intravenous contrast. Multiplanar reconstructed images and MIPs were obtained and reviewed to evaluate the vascular anatomy.  CONTRAST:  75 mL of Isovue 370.  COMPARISON:  No priors.  FINDINGS: CTA CHEST FINDINGS  Cardiovascular: Heart size is mildly enlarged with left atrial dilatation. There is no significant pericardial fluid, thickening or pericardial calcification. No significant atherosclerotic disease noted in the thoracic aorta. Specifically, no aneurysm or dissection. No coronary artery calcifications.  Mediastinum/Nodes: No pathologically enlarged mediastinal or hilar lymph nodes. Esophagus is unremarkable in appearance. No axillary lymphadenopathy.  Lungs/Pleura: 5 mm subpleural nodule in the anterior aspects of the right middle lobe (axial image 61 of series 5). 10 x 4 mm (mean diameter of 7 mm) subpleural nodule associated with the right major fissure in the anterior aspect of the right lower lobe (axial image 56 of series 5). A few other scattered smaller pulmonary nodules are also noted. No acute consolidative airspace disease. No pleural effusions.  Musculoskeletal: There are no aggressive appearing lytic or blastic lesions noted in the visualized portions of the skeleton.  Review of the MIP images confirms the above findings.  CTA ABDOMEN AND PELVIS FINDINGS  VASCULAR  Aorta: Normal caliber aorta without aneurysm, dissection, vasculitis or significant  stenosis.  Celiac: Patent without evidence of aneurysm, dissection, vasculitis or significant stenosis.  SMA: Patent without evidence of aneurysm, dissection, vasculitis or significant stenosis.  Renals: Both renal arteries are patent without evidence of aneurysm, dissection, vasculitis, fibromuscular dysplasia or significant stenosis.  IMA: Patent without evidence of aneurysm, dissection, vasculitis or significant stenosis.  Inflow: Patent without evidence of aneurysm, dissection, vasculitis or significant stenosis. There is extensive tortuosity of the common and external iliac arteries bilaterally.  Veins: No obvious venous abnormality within the limitations of this arterial phase study.  Review of the MIP images confirms the above findings.  NON-VASCULAR  Hepatobiliary: Mild diffuse low attenuation throughout the hepatic parenchyma, compatible with a background of hepatic steatosis. No suspicious cystic or solid hepatic lesions. No intra or extrahepatic biliary ductal dilatation. Gallbladder is normal in appearance.  Pancreas: No pancreatic mass. No pancreatic ductal dilatation. No  pancreatic or peripancreatic fluid or inflammatory changes.  Spleen: Unremarkable.  Adrenals/Urinary Tract: Status post right nephrectomy. Left kidney and left adrenal gland are normal in appearance. 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 No hydroureteronephrosis. Urinary bladder is normal in appearance.  Stomach/Bowel: Normal appearance of the stomach. No pathologic dilatation of small bowel or colon. Numerous colonic diverticulae are noted, without surrounding inflammatory changes to suggest an acute diverticulitis at this time. Normal appendix. Right-sided lumbar hernia containing a short segment of the hepatic flexure of the colon (axial image 126 of series 4).  Lymphatic: No lymphadenopathy  identified in the abdomen or pelvis.  Reproductive: Prostate gland and seminal vesicles are unremarkable in appearance.  Other: No significant volume of ascites.  No pneumoperitoneum.  Musculoskeletal: There are no aggressive appearing lytic or blastic lesions noted in the visualized portions of the skeleton.  Review of the MIP images confirms the above findings.  IMPRESSION: 1. No evidence of significant atherosclerotic disease in the chest, abdomen or pelvis. Specifically, no aortic aneurysm, and no aortoiliac occlusive disease. 2. There is extensive tortuosity of both the common iliac and external iliac vessels. 3. Cardiomegaly with left atrial dilatation. 4. Hepatic steatosis. 5. Status post right nephrectomy. Small lumbar hernia on the right side containing a short segment of the hepatic flexure of the colon, without evidence of bowel incarceration or obstruction at this time. 6. Additional incidental findings, as above.   Electronically Signed   By: Trudie Reed M.D.   On: 07/16/2017 13:46      Right/Left Heart Cath and Coronary Angiography  Conclusion     The left ventricular systolic function is normal.  LV end diastolic pressure is normal.  The left ventricular ejection fraction is 55-65% by visual estimate.  Hemodynamic findings consistent with moderate pulmonary hypertension.  LV end diastolic pressure is normal.   1. Normal coronary anatomy 2. Normal LV function 3. Moderate to severe MR 4. Moderate pulmonary HTN with elevated PCWP.    Indications   Non-rheumatic mitral regurgitation [I34.0 (ICD-10-CM)]  Procedural Details/Technique   Technical Details Indication: 66 yo WM with MV prolapse and severe MR  Procedural Details: The right wrist was prepped, draped, and anesthetized with 1% lidocaine. Using the modified Seldinger technique a 6 Fr slender sheath was placed in the right radial artery and a 5 French sheath was placed in the  right brachial vein. A Swan-Ganz catheter was used for the right heart catheterization. Standard protocol was followed for recording of right heart pressures and sampling of oxygen saturations. Fick cardiac output was calculated. Standard Judkins catheters were used for selective coronary angiography and left ventriculography. There were no immediate procedural complications. The patient was transferred to the post catheterization recovery area for further monitoring.  Contrast: 60 cc   Estimated blood loss <50 mL.  During this procedure the patient was administered the following to achieve and maintain moderate conscious sedation: Versed 1 mg, Fentanyl 25 mcg, while the patient's heart rate, blood pressure, and oxygen saturation were continuously monitored. The period of conscious sedation was 38 minutes, of which I was present face-to-face 100% of this time.    Complications   Complications documented before study signed (07/19/2017 10:46 AM EDT)    No complications were associated with this study.  Documented by [place], Peter M, MD - 07/19/2017 10:40 AM EDT    Coronary Findings   Dominance: Right  Left Main  Vessel was injected. Vessel is normal  in caliber. Vessel is angiographically normal.  Left Anterior Descending  Vessel was injected. Vessel is normal in caliber. Vessel is angiographically normal.  Left Circumflex  Vessel was injected. Vessel is normal in caliber. Vessel is angiographically normal.  Right Coronary Artery  Vessel was injected. Vessel is normal in caliber. Vessel is angiographically normal.  Right Heart   Right Heart Pressures Hemodynamic findings consistent with moderate pulmonary hypertension. LV EDP is normal.    Wall Motion              Left Heart   Left Ventricle The left ventricular size is normal. The left ventricular systolic function is normal. LV end diastolic pressure is normal. The left ventricular ejection fraction is 55-65% by visual estimate.  No regional wall motion abnormalities. There is moderate to severe mitral regurgitation.    Coronary Diagrams   Diagnostic Diagram       Implants     No implant documentation for this case.  PACS Images   Show images for Cardiac catheterization   Link to Procedure Log   Procedure Log    Hemo Data    Most Recent Value  Fick Cardiac Output 4.98 L/min  Fick Cardiac Output Index 2.33 (L/min)/BSA  RA A Wave 8 mmHg  RA V Wave 3 mmHg  RA Mean 4 mmHg  RV Systolic Pressure 51 mmHg  RV Diastolic Pressure 2 mmHg  RV EDP 7 mmHg  PA Systolic Pressure 52 mmHg  PA Diastolic Pressure 25 mmHg  PA Mean 37 mmHg  PW A Wave 26 mmHg  PW V Wave 24 mmHg  PW Mean 24 mmHg  AO Systolic Pressure 134 mmHg  AO Diastolic Pressure 90 mmHg  AO Mean 112 mmHg  LV Systolic Pressure 136 mmHg  LV Diastolic Pressure 8 mmHg  LV EDP 18 mmHg  Arterial Occlusion Pressure Extended Systolic Pressure 138 mmHg  Arterial Occlusion Pressure Extended Diastolic Pressure 81 mmHg  Arterial Occlusion Pressure Extended Mean Pressure 108 mmHg  Left Ventricular Apex Extended Systolic Pressure 139 mmHg  Left Ventricular Apex Extended Diastolic Pressure 5 mmHg  Left Ventricular Apex Extended EDP Pressure 19 mmHg  QP/QS 1  TPVR Index 15.91 HRUI  TSVR Index 48.16 HRUI  PVR SVR Ratio 0.12  TPVR/TSVR Ratio 0.33

## 2017-07-22 NOTE — Progress Notes (Signed)
PCP - Philemon Kingdomaroline Prochnau  Cardiologist - none  Chest x-ray - 07/22/2017  EKG - 07/19/17 ECHO - 07/08/17 Cardiac Cath - 07/19/17   Patient denies shortness of breath, fever, cough and chest pain at PAT appointment   Patient verbalized understanding of instructions that were given to them at the PAT appointment. Patient was also instructed that they will need to review over the PAT instructions again at home before surgery.

## 2017-07-23 ENCOUNTER — Encounter (HOSPITAL_COMMUNITY): Payer: Self-pay | Admitting: Certified Registered Nurse Anesthetist

## 2017-07-23 ENCOUNTER — Encounter (HOSPITAL_COMMUNITY): Payer: PPO

## 2017-07-23 ENCOUNTER — Ambulatory Visit (INDEPENDENT_AMBULATORY_CARE_PROVIDER_SITE_OTHER): Payer: PPO | Admitting: Thoracic Surgery (Cardiothoracic Vascular Surgery)

## 2017-07-23 ENCOUNTER — Encounter: Payer: Self-pay | Admitting: Thoracic Surgery (Cardiothoracic Vascular Surgery)

## 2017-07-23 ENCOUNTER — Encounter: Payer: PPO | Admitting: Thoracic Surgery (Cardiothoracic Vascular Surgery)

## 2017-07-23 VITALS — BP 140/84 | HR 80 | Resp 20 | Ht 67.0 in | Wt 238.0 lb

## 2017-07-23 DIAGNOSIS — I340 Nonrheumatic mitral (valve) insufficiency: Secondary | ICD-10-CM | POA: Diagnosis not present

## 2017-07-23 DIAGNOSIS — I341 Nonrheumatic mitral (valve) prolapse: Secondary | ICD-10-CM

## 2017-07-23 LAB — HEMOGLOBIN A1C
Hgb A1c MFr Bld: 5.5 % (ref 4.8–5.6)
Mean Plasma Glucose: 111 mg/dL

## 2017-07-23 MED ORDER — POTASSIUM CHLORIDE 2 MEQ/ML IV SOLN
80.0000 meq | INTRAVENOUS | Status: DC
  Filled 2017-07-23: qty 40

## 2017-07-23 MED ORDER — PLASMA-LYTE 148 IV SOLN
INTRAVENOUS | Status: DC
  Filled 2017-07-23: qty 2.5

## 2017-07-23 MED ORDER — NITROGLYCERIN IN D5W 200-5 MCG/ML-% IV SOLN
2.0000 ug/min | INTRAVENOUS | Status: AC
Start: 2017-07-24 — End: 2017-07-24
  Administered 2017-07-24: 3 ug/min via INTRAVENOUS
  Filled 2017-07-23: qty 250

## 2017-07-23 MED ORDER — DEXTROSE 5 % IV SOLN
750.0000 mg | INTRAVENOUS | Status: DC
  Filled 2017-07-23: qty 750

## 2017-07-23 MED ORDER — TRANEXAMIC ACID (OHS) PUMP PRIME SOLUTION
2.0000 mg/kg | INTRAVENOUS | Status: DC
  Filled 2017-07-23: qty 2.16

## 2017-07-23 MED ORDER — DEXTROSE 5 % IV SOLN
1.5000 g | INTRAVENOUS | Status: AC
  Administered 2017-07-24: 1.5 g via INTRAVENOUS
  Filled 2017-07-23: qty 1.5

## 2017-07-23 MED ORDER — KENNESTONE BLOOD CARDIOPLEGIA VIAL
13.0000 mL | Freq: Once | Status: DC
  Filled 2017-07-23 (×2): qty 1

## 2017-07-23 MED ORDER — EPINEPHRINE PF 1 MG/ML IJ SOLN
0.0000 ug/min | INTRAVENOUS | Status: DC
  Filled 2017-07-23: qty 4

## 2017-07-23 MED ORDER — SODIUM CHLORIDE 0.9 % IV SOLN
INTRAVENOUS | Status: AC
  Administered 2017-07-24: 10:00:00
  Filled 2017-07-23: qty 1000

## 2017-07-23 MED ORDER — TRANEXAMIC ACID (OHS) BOLUS VIA INFUSION
15.0000 mg/kg | INTRAVENOUS | Status: AC
  Administered 2017-07-24: 1620 mg via INTRAVENOUS
  Filled 2017-07-23: qty 1620

## 2017-07-23 MED ORDER — DOPAMINE-DEXTROSE 3.2-5 MG/ML-% IV SOLN
0.0000 ug/kg/min | INTRAVENOUS | Status: DC
  Filled 2017-07-23: qty 250

## 2017-07-23 MED ORDER — HEPARIN SODIUM (PORCINE) 1000 UNIT/ML IJ SOLN
INTRAMUSCULAR | Status: DC
  Filled 2017-07-23: qty 30

## 2017-07-23 MED ORDER — TRANEXAMIC ACID 1000 MG/10ML IV SOLN
1.5000 mg/kg/h | INTRAVENOUS | Status: AC
  Administered 2017-07-24: 1.5 mg/kg/h via INTRAVENOUS
  Filled 2017-07-23: qty 25

## 2017-07-23 MED ORDER — SODIUM CHLORIDE 0.9 % IV SOLN
30.0000 ug/min | INTRAVENOUS | Status: AC
  Administered 2017-07-24: 30 ug/min via INTRAVENOUS
  Filled 2017-07-23: qty 2

## 2017-07-23 MED ORDER — KENNESTONE BLOOD CARDIOPLEGIA (KBC) MANNITOL SYRINGE (20%, 32ML)
32.0000 mL | Freq: Once | INTRAVENOUS | Status: DC
  Filled 2017-07-23 (×2): qty 1

## 2017-07-23 MED ORDER — DEXMEDETOMIDINE HCL IN NACL 400 MCG/100ML IV SOLN
0.1000 ug/kg/h | INTRAVENOUS | Status: AC
  Administered 2017-07-24: .3 ug/kg/h via INTRAVENOUS
  Filled 2017-07-23: qty 100

## 2017-07-23 MED ORDER — MAGNESIUM SULFATE 50 % IJ SOLN
40.0000 meq | INTRAMUSCULAR | Status: DC
  Filled 2017-07-23: qty 10

## 2017-07-23 MED ORDER — VANCOMYCIN HCL 10 G IV SOLR
1500.0000 mg | INTRAVENOUS | Status: AC
  Administered 2017-07-24: 1500 mg via INTRAVENOUS
  Filled 2017-07-23: qty 1500

## 2017-07-23 MED ORDER — SODIUM CHLORIDE 0.9 % IV SOLN
INTRAVENOUS | Status: AC
  Administered 2017-07-24: 1.1 [IU]/h via INTRAVENOUS
  Filled 2017-07-23: qty 1

## 2017-07-23 NOTE — Progress Notes (Signed)
Anesthesia Chart Review:  Pt is a 66 year old male scheduled for minimally invasive mitral valve repair on 07/24/2017 with Tressie Stalkerlarence Owen, MD  - PCP is Philemon Kingdomaroline Prochnau, MD  PMH includes:  MVP, severe mitral regurgitation, HTN.  Former smoker. BMI 37. S/p R nephrectomy.   Medications include: olmesartan  BP (!) 152/80   Pulse 89   Temp 36.8 C   Resp 20   Ht 5\' 7"  (1.702 m)   Wt 238 lb 11.2 oz (108.3 kg)   SpO2 98%   BMI 37.39 kg/m   Preoperative labs reviewed.  PT normal, PTT 41.  Dr. Cornelius Moraswen is aware.   CXR 07/22/17: No acute abnormality.   EKG 07/19/17: NSR  Carotid duplex 07/22/17:  - The vertebral arteries appear patent with antegrade flow. - Findings consistent with 1- 39 percent stenosis involving the B internal carotid artery  Cardiac cath 07/19/17:  1. Normal coronary anatomy 2. Normal LV function 3. Moderate to severe MR 4. Moderate pulmonary HTN with elevated PCWP  If no changes, I anticipate pt can proceed with surgery as scheduled.   Rica Mastngela Cherica Heiden, FNP-BC [facility] Phone: [phone] 07/23/2017 9:25 AM

## 2017-07-23 NOTE — Anesthesia Preprocedure Evaluation (Addendum)
Anesthesia Evaluation  Patient identified by MRN, date of birth, ID band Patient awake    Reviewed: Allergy & Precautions, NPO status , Patient's Chart, lab work & pertinent test results  History of Anesthesia Complications Negative for: history of anesthetic complications  Airway Mallampati: III  TM Distance: >3 FB Neck ROM: Full    Dental  (+) Dental Advisory Given   Pulmonary COPD, former smoker (quit 1998),    breath sounds clear to auscultation       Cardiovascular hypertension, Pt. on medications (-) angina(-) CAD + Valvular Problems/Murmurs MR and MVP  Rhythm:Regular Rate:Normal + Systolic murmurs 1/476/18 [facility]): mitral valve prolapse with a large flail segment involving the middle scallop of the posterior leaflet and severe mitral regurgitation. Left ventricular size and systolic function appear normal. The left atrium is dilated. Mild AI   Neuro/Psych negative neurological ROS     GI/Hepatic negative GI ROS, Neg liver ROS,   Endo/Other  Morbid obesity  Renal/GU negative Renal ROS     Musculoskeletal  (+) Arthritis ,   Abdominal (+) + obese,   Peds  Hematology negative hematology ROS (+)   Anesthesia Other Findings   Reproductive/Obstetrics                            Anesthesia Physical Anesthesia Plan  ASA: III  Anesthesia Plan: General   Post-op Pain Management:    Induction: Intravenous  PONV Risk Score and Plan: 2 and Ondansetron, Dexamethasone and Treatment may vary due to age or medical condition  Airway Management Planned: Oral ETT and Double Lumen EBT  Additional Equipment: Arterial line, PA Cath, 3D TEE and Ultrasound Guidance Line Placement  Intra-op Plan:   Post-operative Plan: Post-operative intubation/ventilation  Informed Consent: I have reviewed the patients History and Physical, chart, labs and discussed the procedure including the risks, benefits and  alternatives for the proposed anesthesia with the patient or authorized representative who has indicated his/her understanding and acceptance.   Dental advisory given  Plan Discussed with: CRNA and Surgeon  Anesthesia Plan Comments: (Plan routine monitors, A line, PA cath, GETA with TEE and post op ventilation  )        Anesthesia Quick Evaluation

## 2017-07-23 NOTE — Patient Instructions (Signed)
   Continue taking all current medications without change through the day before surgery.  Have nothing to eat or drink after midnight the night before surgery.  On the morning of surgery do not take any medications   

## 2017-07-23 NOTE — Progress Notes (Signed)
[address]       [place] [address]             [phone]     CARDIOTHORACIC SURGERY OFFICE NOTE  Referring Provider is Philemon Kingdom, MD  Primary Cardiologist is Revankar, Glean Hess, MD PCP is Philemon Kingdom, MD   HPI:  Patient is a 66 year old male with mitral valve prolapse and severe symptomatic primary mitral regurgitation who returns to the office today with plans to proceed with elective mitral valve repair tomorrow. He was originally seen in consultation on 07/08/2017. Since then he underwent diagnostic cardiac catheterization which was notable for the absence of significant coronary artery disease. The patient did have moderately elevated pulmonary artery pressures consistent with the presence of severe mitral regurgitation. CT angiography was performed and notable for the absence of any contraindications to peripheral arterial cannulation for surgery. The patient returns to the office with his wife and daughter present for final consultation prior to surgery. He reports no new problems or complaints.   Current Outpatient Prescriptions  Medication Sig Dispense Refill  . olmesartan (BENICAR) 40 MG tablet Take 40 mg by mouth daily.     Marland Kitchen amoxicillin (AMOXIL) 500 MG capsule Take 2,000 mg by mouth. 4 capsules...1 hr before any dental procedures     No current facility-administered medications for this visit.    Facility-Administered Medications Ordered in Other Visits  Medication Dose Route Frequency Provider Last Rate Last Dose  . [START ON 07/24/2017] cefUROXime (ZINACEF) 1.5 g in dextrose 5 % 50 mL IVPB  1.5 g Intravenous To OR Purcell Nails, MD      . Melene Muller ON 07/24/2017] cefUROXime (ZINACEF) 750 mg in dextrose 5 % 50 mL IVPB  750 mg Intravenous To OR Purcell Nails, MD      . Melene Muller ON 07/24/2017] dexmedetomidine (PRECEDEX) 400 MCG/100ML (4 mcg/mL) infusion  0.1-0.7 mcg/kg/hr Intravenous To OR Purcell Nails, MD      . Melene Muller ON 07/24/2017]  DOPamine (INTROPIN) 800 mg in dextrose 5 % 250 mL (3.2 mg/mL) infusion  0-10 mcg/kg/min Intravenous To OR Purcell Nails, MD      . Melene Muller ON 07/24/2017] EPINEPHrine (ADRENALIN) 4 mg in dextrose 5 % 250 mL (0.016 mg/mL) infusion  0-10 mcg/min Intravenous To OR Purcell Nails, MD      . Melene Muller ON 07/24/2017] heparin 2,500 Units, papaverine 30 mg in electrolyte-148 (PLASMALYTE-148) 500 mL irrigation   Irrigation To OR Purcell Nails, MD      . Melene Muller ON 07/24/2017] heparin 30,000 units/NS 1000 mL solution for CELLSAVER   Other To OR Purcell Nails, MD      . Melene Muller ON 07/24/2017] insulin regular (NOVOLIN R,HUMULIN R) 100 Units in sodium chloride 0.9 % 100 mL (1 Units/mL) infusion   Intravenous To OR Purcell Nails, MD      . Melene Muller ON 07/24/2017] [facility] ([facility]) lidocaine 2% Syringe (13mL)  13 mL Intracoronary Once Purcell Nails, MD      . Melene Muller ON 07/24/2017] [facility] Blood [facility]oplegia (KBC) lidocaine 2% Syringe (13mL)  13 mL Intracoronary Once Purcell Nails, MD      . Melene Muller ON 07/24/2017] [facility] ([facility]) mannitol 20% Syringe (32mL)  32 mL Intracoronary Once Purcell Nails, MD      . Melene Muller ON 07/24/2017] [facility]ople[facility] (KBC) mannitol 20% Syringe (32mL)  32 mL Intracoronary Once Purcell Nails, MD      . [  START ON 07/24/2017] magnesium sulfate (IV Push/IM) injection 40 mEq  40 mEq Other To OR Purcell Nailswen, Calixto Pavel H, MD      . Melene Muller[START ON 07/24/2017] nitroGLYCERIN 50 mg in dextrose 5 % 250 mL (0.2 mg/mL) infusion  2-200 mcg/min Intravenous To OR Purcell Nailswen, Briggett Tuccillo H, MD      . Melene Muller[START ON 07/24/2017] phenylephrine (NEO-SYNEPHRINE) 20 mg in sodium chloride 0.9 % 250 mL (0.08 mg/mL) infusion  30-200 mcg/min Intravenous To OR Purcell Nailswen, Arian Murley H, MD      . Melene Muller[START ON 07/24/2017] potassium chloride injection 80 mEq  80 mEq Other To OR Purcell Nailswen, Joylyn Duggin H, MD      . Melene Muller[START ON 07/24/2017] tranexamic acid (CYKLOKAPRON) 2,500 mg in sodium chloride 0.9 % 250 mL (10 mg/mL) infusion   1.5 mg/kg/hr Intravenous To OR Purcell Nailswen, Brexlee Heberlein H, MD      . Melene Muller[START ON 07/24/2017] tranexamic acid (CYKLOKAPRON) bolus via infusion - over 30 minutes 1,620 mg  15 mg/kg Intravenous To OR Purcell Nailswen, Jackelyn Illingworth H, MD      . Melene Muller[START ON 07/24/2017] tranexamic acid (CYKLOKAPRON) pump prime solution 216 mg  2 mg/kg Intracatheter To OR Purcell Nailswen, Odette Watanabe H, MD      . Melene Muller[START ON 07/24/2017] vancomycin (VANCOCIN) 1,000 mg in sodium chloride 0.9 % 1,000 mL irrigation   Irrigation To OR Purcell Nailswen, Hilde Churchman H, MD      . Melene Muller[START ON 07/24/2017] vancomycin (VANCOCIN) 1,500 mg in sodium chloride 0.9 % 250 mL IVPB  1,500 mg Intravenous To OR Purcell Nailswen, Amiliah Campisi H, MD          Physical Exam:   BP 140/84   Pulse 80   Resp 20   Ht 5\' 7"  (1.702 m)   Wt 238 lb (108 kg)   SpO2 98% Comment: RA  BMI 37.28 kg/m   General:  Well-appearing  Chest:   Clear to auscultation  CV:   Regular rate and rhythm with prominent systolic murmur  Incisions:  n/a  Abdomen:  Soft and nontender  Extremities:  Warm and well-perfused  Diagnostic Tests:  CARDIAC CATHETERIZATION Right/Left Heart Cath and Coronary Angiography  Conclusion     The left ventricular systolic function is normal.  LV end diastolic pressure is normal.  The left ventricular ejection fraction is 55-65% by visual estimate.  Hemodynamic findings consistent with moderate pulmonary hypertension.  LV end diastolic pressure is normal.   1. Normal coronary anatomy 2. Normal LV function 3. Moderate to severe MR 4. Moderate pulmonary HTN with elevated PCWP.    Indications   Non-rheumatic mitral regurgitation [I34.0 (ICD-10-CM)]  Procedural Details/Technique   Technical Details Indication: 66 yo WM with MV prolapse and severe MR  Procedural Details: The right wrist was prepped, draped, and anesthetized with 1% lidocaine. Using the modified Seldinger technique a 6 Fr slender sheath was placed in the right radial artery and a 5 French sheath was placed in the right brachial vein.  A Swan-Ganz catheter was used for the right heart catheterization. Standard protocol was followed for recording of right heart pressures and sampling of oxygen saturations. Fick cardiac output was calculated. Standard Judkins catheters were used for selective coronary angiography and left ventriculography. There were no immediate procedural complications. The patient was transferred to the post catheterization recovery area for further monitoring.  Contrast: 60 cc   Estimated blood loss <50 mL.  During this procedure the patient was administered the following to achieve and maintain moderate conscious sedation: Versed 1 mg, Fentanyl 25 mcg, while the patient's heart  rate, blood pressure, and oxygen saturation were continuously monitored. The period of conscious sedation was 38 minutes, of which I was present face-to-face 100% of this time.    Complications   Complications documented before study signed (07/19/2017 10:46 AM EDT)    No complications were associated with this study.  Documented by [place], Peter M, MD - 07/19/2017 10:40 AM EDT    Coronary Findings   Dominance: Right  Left Main  Vessel was injected. Vessel is normal in caliber. Vessel is angiographically normal.  Left Anterior Descending  Vessel was injected. Vessel is normal in caliber. Vessel is angiographically normal.  Left Circumflex  Vessel was injected. Vessel is normal in caliber. Vessel is angiographically normal.  Right Coronary Artery  Vessel was injected. Vessel is normal in caliber. Vessel is angiographically normal.  Right Heart   Right Heart Pressures Hemodynamic findings consistent with moderate pulmonary hypertension. LV EDP is normal.    Wall Motion              Left Heart   Left Ventricle The left ventricular size is normal. The left ventricular systolic function is normal. LV end diastolic pressure is normal. The left ventricular ejection fraction is 55-65% by visual estimate. No regional wall  motion abnormalities. There is moderate to severe mitral regurgitation.    Coronary Diagrams   Diagnostic Diagram       Implants     No implant documentation for this case.  PACS Images   Show images for Cardiac catheterization   Link to Procedure Log   Procedure Log    Hemo Data    Most Recent Value  Fick Cardiac Output 4.98 L/min  Fick Cardiac Output Index 2.33 (L/min)/BSA  RA A Wave 8 mmHg  RA V Wave 3 mmHg  RA Mean 4 mmHg  RV Systolic Pressure 51 mmHg  RV Diastolic Pressure 2 mmHg  RV EDP 7 mmHg  PA Systolic Pressure 52 mmHg  PA Diastolic Pressure 25 mmHg  PA Mean 37 mmHg  PW A Wave 26 mmHg  PW V Wave 24 mmHg  PW Mean 24 mmHg  AO Systolic Pressure 134 mmHg  AO Diastolic Pressure 90 mmHg  AO Mean 112 mmHg  LV Systolic Pressure 136 mmHg  LV Diastolic Pressure 8 mmHg  LV EDP 18 mmHg  Arterial Occlusion Pressure Extended Systolic Pressure 138 mmHg  Arterial Occlusion Pressure Extended Diastolic Pressure 81 mmHg  Arterial Occlusion Pressure Extended Mean Pressure 108 mmHg  Left Ventricular Apex Extended Systolic Pressure 139 mmHg  Left Ventricular Apex Extended Diastolic Pressure 5 mmHg  Left Ventricular Apex Extended EDP Pressure 19 mmHg  QP/QS 1  TPVR Index 15.91 HRUI  TSVR Index 48.16 HRUI  PVR SVR Ratio 0.12  TPVR/TSVR Ratio 0.33     CT ANGIOGRAPHY CHEST, ABDOMEN AND PELVIS  TECHNIQUE: Multidetector CT imaging through the chest, abdomen and pelvis was performed using the standard protocol during bolus administration of intravenous contrast. Multiplanar reconstructed images and MIPs were obtained and reviewed to evaluate the vascular anatomy.  CONTRAST:  75 mL of Isovue 370.  COMPARISON:  No priors.  FINDINGS: CTA CHEST FINDINGS  Cardiovascular: Heart size is mildly enlarged with left atrial dilatation. There is no significant pericardial fluid, thickening or pericardial calcification. No significant atherosclerotic disease noted in the  thoracic aorta. Specifically, no aneurysm or dissection. No coronary artery calcifications.  Mediastinum/Nodes: No pathologically enlarged mediastinal or hilar lymph nodes. Esophagus is unremarkable in appearance. No axillary lymphadenopathy.  Lungs/Pleura: 5 mm subpleural  nodule in the anterior aspects of the right middle lobe (axial image 61 of series 5). 10 x 4 mm (mean diameter of 7 mm) subpleural nodule associated with the right major fissure in the anterior aspect of the right lower lobe (axial image 56 of series 5). A few other scattered smaller pulmonary nodules are also noted. No acute consolidative airspace disease. No pleural effusions.  Musculoskeletal: There are no aggressive appearing lytic or blastic lesions noted in the visualized portions of the skeleton.  Review of the MIP images confirms the above findings.  CTA ABDOMEN AND PELVIS FINDINGS  VASCULAR  Aorta: Normal caliber aorta without aneurysm, dissection, vasculitis or significant stenosis.  Celiac: Patent without evidence of aneurysm, dissection, vasculitis or significant stenosis.  SMA: Patent without evidence of aneurysm, dissection, vasculitis or significant stenosis.  Renals: Both renal arteries are patent without evidence of aneurysm, dissection, vasculitis, fibromuscular dysplasia or significant stenosis.  IMA: Patent without evidence of aneurysm, dissection, vasculitis or significant stenosis.  Inflow: Patent without evidence of aneurysm, dissection, vasculitis or significant stenosis. There is extensive tortuosity of the common and external iliac arteries bilaterally.  Veins: No obvious venous abnormality within the limitations of this arterial phase study.  Review of the MIP images confirms the above findings.  NON-VASCULAR  Hepatobiliary: Mild diffuse low attenuation throughout the hepatic parenchyma, compatible with a background of hepatic steatosis. No suspicious  cystic or solid hepatic lesions. No intra or extrahepatic biliary ductal dilatation. Gallbladder is normal in appearance.  Pancreas: No pancreatic mass. No pancreatic ductal dilatation. No pancreatic or peripancreatic fluid or inflammatory changes.  Spleen: Unremarkable.  Adrenals/Urinary Tract: Status post right nephrectomy. Left kidney and left adrenal gland are normal in appearance. 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 No hydroureteronephrosis. Urinary bladder is normal in appearance.  Stomach/Bowel: Normal appearance of the stomach. No pathologic dilatation of small bowel or colon. Numerous colonic diverticulae are noted, without surrounding inflammatory changes to suggest an acute diverticulitis at this time. Normal appendix. Right-sided lumbar hernia containing a short segment of the hepatic flexure of the colon (axial image 126 of series 4).  Lymphatic: No lymphadenopathy identified in the abdomen or pelvis.  Reproductive: Prostate gland and seminal vesicles are unremarkable in appearance.  Other: No significant volume of ascites.  No pneumoperitoneum.  Musculoskeletal: There are no aggressive appearing lytic or blastic lesions noted in the visualized portions of the skeleton.  Review of the MIP images confirms the above findings.  IMPRESSION: 1. No evidence of significant atherosclerotic disease in the chest, abdomen or pelvis. Specifically, no aortic aneurysm, and no aortoiliac occlusive disease. 2. There is extensive tortuosity of both the common iliac and external iliac vessels. 3. Cardiomegaly with left atrial dilatation. 4. Hepatic steatosis. 5. Status post right nephrectomy. Small lumbar hernia on the right side containing a short segment of the hepatic flexure of the colon, without evidence of bowel incarceration or obstruction at this time. 6. Additional incidental  findings, as above.   Electronically Signed   By: Trudie Reed M.D.   On: 07/16/2017 13:46    Impression:  Patient has mitral valve prolapse with stage D severe symptomatic primary mitral regurgitation. He presents with stable symptoms of exertional shortness of breath consistent with chronic diastolic congestive heart failure, [place] Heart Association functional class II. Diagnostic cardiac catheterization is notable for the absence of significant coronary artery disease and CT angiography revealed no contraindication to peripheral arterial cannulation for surgery.  Plan:  I have again reviewed the indications, risks, and potential benefits of elective mitral valve repair with the patient and his family in the office today.  Expectations for his postoperative convalescence at been discussed. All of their questions been addressed. We plan to proceed with surgery tomorrow.  I spent in excess of 15 minutes during the conduct of this office consultation and >50% of this time involved direct face-to-face encounter with the patient for counseling and/or coordination of their care.    Salvatore Decentlarence H. Cornelius Moraswen, MD 07/23/2017 3:25 PM

## 2017-07-24 ENCOUNTER — Encounter (HOSPITAL_COMMUNITY): Payer: Self-pay | Admitting: *Deleted

## 2017-07-24 ENCOUNTER — Encounter (HOSPITAL_COMMUNITY)
Admission: RE | Disposition: A | Discharge status: Home / Self Care | Payer: Self-pay | Source: Ambulatory Visit | Attending: Thoracic Surgery (Cardiothoracic Vascular Surgery)

## 2017-07-24 ENCOUNTER — Other Ambulatory Visit: Payer: Self-pay

## 2017-07-24 ENCOUNTER — Inpatient Hospital Stay (HOSPITAL_COMMUNITY): Payer: PPO

## 2017-07-24 ENCOUNTER — Inpatient Hospital Stay (HOSPITAL_COMMUNITY): Payer: PPO | Admitting: Anesthesiology

## 2017-07-24 ENCOUNTER — Inpatient Hospital Stay (HOSPITAL_COMMUNITY): Payer: PPO | Admitting: Emergency Medicine

## 2017-07-24 ENCOUNTER — Inpatient Hospital Stay (HOSPITAL_COMMUNITY)
Admission: RE | Admit: 2017-07-24 | Discharge: 2017-07-28 | DRG: 220 | Disposition: A | Discharge status: Home / Self Care | Payer: PPO | Source: Ambulatory Visit | Attending: Thoracic Surgery (Cardiothoracic Vascular Surgery) | Admitting: Thoracic Surgery (Cardiothoracic Vascular Surgery)

## 2017-07-24 DIAGNOSIS — Z9889 Other specified postprocedural states: Secondary | ICD-10-CM

## 2017-07-24 DIAGNOSIS — Z6837 Body mass index (BMI) 37.0-37.9, adult: Secondary | ICD-10-CM

## 2017-07-24 DIAGNOSIS — D62 Acute posthemorrhagic anemia: Secondary | ICD-10-CM | POA: Diagnosis not present

## 2017-07-24 DIAGNOSIS — I340 Nonrheumatic mitral (valve) insufficiency: Secondary | ICD-10-CM | POA: Diagnosis present

## 2017-07-24 DIAGNOSIS — I351 Nonrheumatic aortic (valve) insufficiency: Secondary | ICD-10-CM | POA: Diagnosis present

## 2017-07-24 DIAGNOSIS — I341 Nonrheumatic mitral (valve) prolapse: Principal | ICD-10-CM | POA: Diagnosis present

## 2017-07-24 DIAGNOSIS — D6959 Other secondary thrombocytopenia: Secondary | ICD-10-CM | POA: Diagnosis not present

## 2017-07-24 DIAGNOSIS — M1990 Unspecified osteoarthritis, unspecified site: Secondary | ICD-10-CM | POA: Diagnosis present

## 2017-07-24 DIAGNOSIS — I272 Pulmonary hypertension, unspecified: Secondary | ICD-10-CM | POA: Diagnosis present

## 2017-07-24 DIAGNOSIS — Z800 Family history of malignant neoplasm of digestive organs: Secondary | ICD-10-CM | POA: Diagnosis not present

## 2017-07-24 DIAGNOSIS — I110 Hypertensive heart disease with heart failure: Secondary | ICD-10-CM | POA: Diagnosis present

## 2017-07-24 DIAGNOSIS — Z905 Acquired absence of kidney: Secondary | ICD-10-CM | POA: Diagnosis not present

## 2017-07-24 DIAGNOSIS — Z808 Family history of malignant neoplasm of other organs or systems: Secondary | ICD-10-CM

## 2017-07-24 DIAGNOSIS — Z8249 Family history of ischemic heart disease and other diseases of the circulatory system: Secondary | ICD-10-CM | POA: Diagnosis not present

## 2017-07-24 DIAGNOSIS — J9811 Atelectasis: Secondary | ICD-10-CM | POA: Diagnosis not present

## 2017-07-24 DIAGNOSIS — I10 Essential (primary) hypertension: Secondary | ICD-10-CM | POA: Diagnosis present

## 2017-07-24 DIAGNOSIS — Z4682 Encounter for fitting and adjustment of non-vascular catheter: Secondary | ICD-10-CM

## 2017-07-24 DIAGNOSIS — I5032 Chronic diastolic (congestive) heart failure: Secondary | ICD-10-CM | POA: Diagnosis present

## 2017-07-24 DIAGNOSIS — Z87891 Personal history of nicotine dependence: Secondary | ICD-10-CM | POA: Diagnosis not present

## 2017-07-24 HISTORY — PX: TEE WITHOUT CARDIOVERSION: SHX5443

## 2017-07-24 HISTORY — DX: Other specified postprocedural states: Z98.890

## 2017-07-24 HISTORY — DX: Chronic diastolic (congestive) heart failure: I50.32

## 2017-07-24 HISTORY — PX: MITRAL VALVE REPAIR: SHX2039

## 2017-07-24 LAB — CREATININE, SERUM
CREATININE: 1.16 mg/dL (ref 0.61–1.24)
GFR calc non Af Amer: >60 mL/min (ref >60)

## 2017-07-24 LAB — POCT I-STAT, CHEM 8
BUN: 17 mg/dL (ref 6–20)
BUN: 15 mg/dL (ref 6–20)
BUN: 17 mg/dL (ref 6–20)
BUN: 16 mg/dL (ref 6–20)
BUN: 16 mg/dL (ref 6–20)
BUN: 16 mg/dL (ref 6–20)
BUN: 19 mg/dL (ref 6–20)
BUN: 15 mg/dL (ref 6–20)
CALCIUM ION: 1.14 mmol/L — AB (ref 1.15–1.40)
CALCIUM ION: 1.07 mmol/L — AB (ref 1.15–1.40)
CALCIUM ION: 1.08 mmol/L — AB (ref 1.15–1.40)
CALCIUM ION: 1.05 mmol/L — AB (ref 1.15–1.40)
CALCIUM ION: 1.08 mmol/L — AB (ref 1.15–1.40)
CHLORIDE: 101 mmol/L (ref 101–111)
CHLORIDE: 106 mmol/L (ref 101–111)
CHLORIDE: 107 mmol/L (ref 101–111)
CHLORIDE: 104 mmol/L (ref 101–111)
CHLORIDE: 109 mmol/L (ref 101–111)
CREATININE: 0.8 mg/dL (ref 0.61–1.24)
CREATININE: 1 mg/dL (ref 0.61–1.24)
Calcium, Ion: 1.22 mmol/L (ref 1.15–1.40)
Calcium, Ion: 1.02 mmol/L — ABNORMAL LOW (ref 1.15–1.40)
Calcium, Ion: 1.05 mmol/L — ABNORMAL LOW (ref 1.15–1.40)
Chloride: 105 mmol/L (ref 101–111)
Chloride: 103 mmol/L (ref 101–111)
Chloride: 105 mmol/L (ref 101–111)
Creatinine, Ser: 0.8 mg/dL (ref 0.61–1.24)
Creatinine, Ser: 0.8 mg/dL (ref 0.61–1.24)
Creatinine, Ser: 0.8 mg/dL (ref 0.61–1.24)
Creatinine, Ser: 0.8 mg/dL (ref 0.61–1.24)
Creatinine, Ser: 0.8 mg/dL (ref 0.61–1.24)
Creatinine, Ser: 1 mg/dL (ref 0.61–1.24)
GLUCOSE: 117 mg/dL — AB (ref 65–99)
GLUCOSE: 96 mg/dL (ref 65–99)
Glucose, Bld: 105 mg/dL — ABNORMAL HIGH (ref 65–99)
Glucose, Bld: 137 mg/dL — ABNORMAL HIGH (ref 65–99)
Glucose, Bld: 141 mg/dL — ABNORMAL HIGH (ref 65–99)
Glucose, Bld: 144 mg/dL — ABNORMAL HIGH (ref 65–99)
Glucose, Bld: 150 mg/dL — ABNORMAL HIGH (ref 65–99)
Glucose, Bld: 160 mg/dL — ABNORMAL HIGH (ref 65–99)
HCT: 33 % — ABNORMAL LOW (ref 39.0–52.0)
HCT: 29 % — ABNORMAL LOW (ref 39.0–52.0)
HEMATOCRIT: 35 % — AB (ref 39.0–52.0)
HEMATOCRIT: 28 % — AB (ref 39.0–52.0)
HEMATOCRIT: 26 % — AB (ref 39.0–52.0)
HEMATOCRIT: 27 % — AB (ref 39.0–52.0)
HEMATOCRIT: 26 % — AB (ref 39.0–52.0)
HEMATOCRIT: 33 % — AB (ref 39.0–52.0)
HEMOGLOBIN: 11.9 g/dL — AB (ref 13.0–17.0)
HEMOGLOBIN: 9.2 g/dL — AB (ref 13.0–17.0)
HEMOGLOBIN: 11.2 g/dL — AB (ref 13.0–17.0)
Hemoglobin: 11.2 g/dL — ABNORMAL LOW (ref 13.0–17.0)
Hemoglobin: 9.5 g/dL — ABNORMAL LOW (ref 13.0–17.0)
Hemoglobin: 8.8 g/dL — ABNORMAL LOW (ref 13.0–17.0)
Hemoglobin: 8.8 g/dL — ABNORMAL LOW (ref 13.0–17.0)
Hemoglobin: 9.9 g/dL — ABNORMAL LOW (ref 13.0–17.0)
POTASSIUM: 4.4 mmol/L (ref 3.5–5.1)
POTASSIUM: 4.6 mmol/L (ref 3.5–5.1)
POTASSIUM: 6 mmol/L — AB (ref 3.5–5.1)
POTASSIUM: 4.3 mmol/L (ref 3.5–5.1)
Potassium: 4.7 mmol/L (ref 3.5–5.1)
Potassium: 5.9 mmol/L — ABNORMAL HIGH (ref 3.5–5.1)
Potassium: 6 mmol/L — ABNORMAL HIGH (ref 3.5–5.1)
Potassium: 5.3 mmol/L — ABNORMAL HIGH (ref 3.5–5.1)
SODIUM: 138 mmol/L (ref 135–145)
SODIUM: 140 mmol/L (ref 135–145)
SODIUM: 136 mmol/L (ref 135–145)
SODIUM: 137 mmol/L (ref 135–145)
SODIUM: 136 mmol/L (ref 135–145)
SODIUM: 140 mmol/L (ref 135–145)
Sodium: 141 mmol/L (ref 135–145)
Sodium: 139 mmol/L (ref 135–145)
TCO2: 27 mmol/L (ref 0–100)
TCO2: 24 mmol/L (ref 0–100)
TCO2: 26 mmol/L (ref 0–100)
TCO2: 24 mmol/L (ref 0–100)
TCO2: 25 mmol/L (ref 0–100)
TCO2: 24 mmol/L (ref 0–100)
TCO2: 24 mmol/L (ref 0–100)
TCO2: 21 mmol/L (ref 0–100)

## 2017-07-24 LAB — HEMOGLOBIN AND HEMATOCRIT, BLOOD
HEMATOCRIT: 28.8 % — AB (ref 39.0–52.0)
HEMOGLOBIN: 10 g/dL — AB (ref 13.0–17.0)

## 2017-07-24 LAB — POCT I-STAT 3, ART BLOOD GAS (G3+)
ACID-BASE DEFICIT: 1 mmol/L (ref 0.0–2.0)
ACID-BASE DEFICIT: 3 mmol/L — AB (ref 0.0–2.0)
Acid-base deficit: 1 mmol/L (ref 0.0–2.0)
Acid-base deficit: 1 mmol/L (ref 0.0–2.0)
Acid-base deficit: 1 mmol/L (ref 0.0–2.0)
Acid-base deficit: 6 mmol/L — ABNORMAL HIGH (ref 0.0–2.0)
Acid-base deficit: 6 mmol/L — ABNORMAL HIGH (ref 0.0–2.0)
BICARBONATE: 25.2 mmol/L (ref 20.0–28.0)
BICARBONATE: 25.8 mmol/L (ref 20.0–28.0)
BICARBONATE: 24.4 mmol/L (ref 20.0–28.0)
BICARBONATE: 19.5 mmol/L — AB (ref 20.0–28.0)
BICARBONATE: 19.6 mmol/L — AB (ref 20.0–28.0)
Bicarbonate: 24.9 mmol/L (ref 20.0–28.0)
Bicarbonate: 24.3 mmol/L (ref 20.0–28.0)
Bicarbonate: 25.7 mmol/L (ref 20.0–28.0)
Bicarbonate: 23.2 mmol/L (ref 20.0–28.0)
Bicarbonate: 23.7 mmol/L (ref 20.0–28.0)
O2 SAT: 100 %
O2 SAT: 100 %
O2 SAT: 100 %
O2 SAT: 100 %
O2 SAT: 92 %
O2 Saturation: 100 %
O2 Saturation: 100 %
O2 Saturation: 100 %
O2 Saturation: 95 %
O2 Saturation: 95 %
PCO2 ART: 45.6 mmHg (ref 32.0–48.0)
PCO2 ART: 46.9 mmHg (ref 32.0–48.0)
PCO2 ART: 40.2 mmHg (ref 32.0–48.0)
PCO2 ART: 37.5 mmHg (ref 32.0–48.0)
PCO2 ART: 38.4 mmHg (ref 32.0–48.0)
PCO2 ART: 35.4 mmHg (ref 32.0–48.0)
PH ART: 7.339 — AB (ref 7.350–7.450)
PH ART: 7.345 — AB (ref 7.350–7.450)
PH ART: 7.4 (ref 7.350–7.450)
PH ART: 7.411 (ref 7.350–7.450)
PH ART: 7.346 — AB (ref 7.350–7.450)
PH ART: 7.331 — AB (ref 7.350–7.450)
PO2 ART: 331 mmHg — AB (ref 83.0–108.0)
PO2 ART: 304 mmHg — AB (ref 83.0–108.0)
PO2 ART: 317 mmHg — AB (ref 83.0–108.0)
PO2 ART: 277 mmHg — AB (ref 83.0–108.0)
PO2 ART: 391 mmHg — AB (ref 83.0–108.0)
PO2 ART: 78 mmHg — AB (ref 83.0–108.0)
Patient temperature: 36.6
TCO2: 26 mmol/L (ref 0–100)
TCO2: 27 mmol/L (ref 0–100)
TCO2: 25 mmol/L (ref 0–100)
TCO2: 27 mmol/L (ref 0–100)
TCO2: 27 mmol/L (ref 0–100)
TCO2: 24 mmol/L (ref 0–100)
TCO2: 26 mmol/L (ref 0–100)
TCO2: 25 mmol/L (ref 0–100)
TCO2: 21 mmol/L (ref 0–100)
TCO2: 21 mmol/L (ref 0–100)
pCO2 arterial: 43.8 mmHg (ref 32.0–48.0)
pCO2 arterial: 46.4 mmHg (ref 32.0–48.0)
pCO2 arterial: 46.9 mmHg (ref 32.0–48.0)
pCO2 arterial: 37 mmHg (ref 32.0–48.0)
pH, Arterial: 7.35 (ref 7.350–7.450)
pH, Arterial: 7.377 (ref 7.350–7.450)
pH, Arterial: 7.389 (ref 7.350–7.450)
pH, Arterial: 7.312 — ABNORMAL LOW (ref 7.350–7.450)
pO2, Arterial: 380 mmHg — ABNORMAL HIGH (ref 83.0–108.0)
pO2, Arterial: 303 mmHg — ABNORMAL HIGH (ref 83.0–108.0)
pO2, Arterial: 71 mmHg — ABNORMAL LOW (ref 83.0–108.0)
pO2, Arterial: 78 mmHg — ABNORMAL LOW (ref 83.0–108.0)

## 2017-07-24 LAB — GLUCOSE, CAPILLARY
GLUCOSE-CAPILLARY: 102 mg/dL — AB (ref 65–99)
GLUCOSE-CAPILLARY: 110 mg/dL — AB (ref 65–99)
GLUCOSE-CAPILLARY: 140 mg/dL — AB (ref 65–99)
Glucose-Capillary: 149 mg/dL — ABNORMAL HIGH (ref 65–99)

## 2017-07-24 LAB — CBC
HCT: 35.1 % — ABNORMAL LOW (ref 39.0–52.0)
HEMATOCRIT: 33.5 % — AB (ref 39.0–52.0)
HEMOGLOBIN: 11.5 g/dL — AB (ref 13.0–17.0)
HEMOGLOBIN: 12.4 g/dL — AB (ref 13.0–17.0)
MCH: 30.6 pg (ref 26.0–34.0)
MCH: 31.2 pg (ref 26.0–34.0)
MCHC: 34.3 g/dL (ref 30.0–36.0)
MCHC: 35.3 g/dL (ref 30.0–36.0)
MCV: 89.1 fL (ref 78.0–100.0)
MCV: 88.4 fL (ref 78.0–100.0)
Platelets: 111 10*3/uL — ABNORMAL LOW (ref 150–400)
Platelets: 150 10*3/uL (ref 150–400)
RBC: 3.76 MIL/uL — AB (ref 4.22–5.81)
RBC: 3.97 MIL/uL — ABNORMAL LOW (ref 4.22–5.81)
RDW: 12.5 % (ref 11.5–15.5)
RDW: 12.4 % (ref 11.5–15.5)
WBC: 11.7 10*3/uL — ABNORMAL HIGH (ref 4.0–10.5)
WBC: 16.4 10*3/uL — ABNORMAL HIGH (ref 4.0–10.5)

## 2017-07-24 LAB — POCT I-STAT 4, (NA,K, GLUC, HGB,HCT)
GLUCOSE: 108 mg/dL — AB (ref 65–99)
HEMATOCRIT: 29 % — AB (ref 39.0–52.0)
HEMOGLOBIN: 9.9 g/dL — AB (ref 13.0–17.0)
Potassium: 4.4 mmol/L (ref 3.5–5.1)
Sodium: 139 mmol/L (ref 135–145)

## 2017-07-24 LAB — PROTIME-INR
INR: 1.36
Prothrombin Time: 16.9 seconds — ABNORMAL HIGH (ref 11.4–15.2)

## 2017-07-24 LAB — APTT: aPTT: 42 seconds — ABNORMAL HIGH (ref 24–36)

## 2017-07-24 LAB — MAGNESIUM: MAGNESIUM: 3 mg/dL — AB (ref 1.7–2.4)

## 2017-07-24 LAB — PLATELET COUNT: PLATELETS: 146 10*3/uL — AB (ref 150–400)

## 2017-07-24 SURGERY — REPAIR, MITRAL VALVE, MINIMALLY INVASIVE
Anesthesia: General | Site: Chest | Laterality: Right

## 2017-07-24 SURGERY — Surgical Case
Anesthesia: *Unknown

## 2017-07-24 MED ORDER — LACTATED RINGERS IV SOLN
INTRAVENOUS | Status: DC
  Administered 2017-07-24 (×2): via INTRAVENOUS

## 2017-07-24 MED ORDER — ROCURONIUM BROMIDE 10 MG/ML (PF) SYRINGE
PREFILLED_SYRINGE | INTRAVENOUS | Status: DC | PRN
  Administered 2017-07-24 (×5): 50 mg via INTRAVENOUS

## 2017-07-24 MED ORDER — AMIODARONE HCL IN DEXTROSE 360-4.14 MG/200ML-% IV SOLN
60.0000 mg/h | INTRAVENOUS | Status: DC
  Filled 2017-07-24: qty 200

## 2017-07-24 MED ORDER — CHLORHEXIDINE GLUCONATE 4 % EX LIQD: 30.0000 mL | CUTANEOUS | Status: DC

## 2017-07-24 MED ORDER — SODIUM CHLORIDE 0.9 % IV SOLN
INTRAVENOUS | Status: DC
  Administered 2017-07-24: 15:00:00 via INTRAVENOUS

## 2017-07-24 MED ORDER — POTASSIUM CHLORIDE 10 MEQ/50ML IV SOLN: 10.0000 meq | INTRAVENOUS | Status: AC

## 2017-07-24 MED ORDER — METOPROLOL TARTRATE 25 MG/10 ML ORAL SUSPENSION: 12.5000 mg | Freq: Two times a day (BID) | ORAL | Status: DC

## 2017-07-24 MED ORDER — ACETAMINOPHEN 500 MG PO TABS
1000.0000 mg | ORAL_TABLET | Freq: Four times a day (QID) | ORAL | Status: DC
  Administered 2017-07-25 – 2017-07-28 (×14): 1000 mg via ORAL
  Filled 2017-07-24 (×13): qty 2

## 2017-07-24 MED ORDER — MIDAZOLAM HCL 2 MG/2ML IJ SOLN: 2.0000 mg | INTRAMUSCULAR | Status: DC | PRN

## 2017-07-24 MED ORDER — ALBUMIN HUMAN 5 % IV SOLN: 250.0000 mL | INTRAVENOUS | Status: AC | PRN

## 2017-07-24 MED ORDER — SODIUM CHLORIDE 0.9% FLUSH: 10.0000 mL | INTRAVENOUS | Status: DC | PRN

## 2017-07-24 MED ORDER — FAMOTIDINE IN NACL 20-0.9 MG/50ML-% IV SOLN
20.0000 mg | Freq: Two times a day (BID) | INTRAVENOUS | Status: DC
  Administered 2017-07-24: 20 mg via INTRAVENOUS

## 2017-07-24 MED ORDER — INSULIN REGULAR BOLUS VIA INFUSION
0.0000 [IU] | Freq: Three times a day (TID) | INTRAVENOUS | Status: DC
  Filled 2017-07-24: qty 10

## 2017-07-24 MED ORDER — BISACODYL 10 MG RE SUPP
10.0000 mg | Freq: Every day | RECTAL | Status: DC
  Filled 2017-07-24: qty 1

## 2017-07-24 MED ORDER — HEPARIN SODIUM (PORCINE) 1000 UNIT/ML IJ SOLN
INTRAMUSCULAR | Status: DC | PRN
  Administered 2017-07-24: 28000 [IU] via INTRAVENOUS

## 2017-07-24 MED ORDER — MORPHINE SULFATE (PF) 2 MG/ML IV SOLN: 1.0000 mg | INTRAVENOUS | Status: DC | PRN

## 2017-07-24 MED ORDER — PROTAMINE SULFATE 10 MG/ML IV SOLN
INTRAVENOUS | Status: DC | PRN
  Administered 2017-07-24: 270 mg via INTRAVENOUS
  Administered 2017-07-24: 10 mg via INTRAVENOUS

## 2017-07-24 MED ORDER — METOPROLOL TARTRATE 12.5 MG HALF TABLET
ORAL_TABLET | ORAL | Status: AC
  Administered 2017-07-24: 12.5 mg via ORAL
  Filled 2017-07-24: qty 1

## 2017-07-24 MED ORDER — LACTATED RINGERS IV SOLN: INTRAVENOUS | Status: DC

## 2017-07-24 MED ORDER — VANCOMYCIN HCL IN DEXTROSE 1-5 GM/200ML-% IV SOLN
1000.0000 mg | Freq: Once | INTRAVENOUS | Status: AC
  Administered 2017-07-25: 1000 mg via INTRAVENOUS
  Filled 2017-07-24: qty 200

## 2017-07-24 MED ORDER — SODIUM CHLORIDE 0.9% FLUSH
3.0000 mL | Freq: Two times a day (BID) | INTRAVENOUS | Status: DC
  Administered 2017-07-27: 3 mL via INTRAVENOUS

## 2017-07-24 MED ORDER — BUPIVACAINE 0.5 % ON-Q PUMP SINGLE CATH 400 ML
400.0000 mL | INJECTION | Status: DC
  Filled 2017-07-24: qty 400

## 2017-07-24 MED ORDER — ALBUMIN HUMAN 5 % IV SOLN
INTRAVENOUS | Status: DC | PRN
  Administered 2017-07-24 (×2): via INTRAVENOUS

## 2017-07-24 MED ORDER — LACTATED RINGERS IV SOLN
INTRAVENOUS | Status: DC | PRN
  Administered 2017-07-24 (×2): via INTRAVENOUS

## 2017-07-24 MED ORDER — NITROGLYCERIN IN D5W 200-5 MCG/ML-% IV SOLN: 0.0000 ug/min | INTRAVENOUS | Status: DC

## 2017-07-24 MED ORDER — INSULIN ASPART 100 UNIT/ML ~~LOC~~ SOLN
0.0000 [IU] | SUBCUTANEOUS | Status: DC
  Administered 2017-07-24: 2 [IU] via SUBCUTANEOUS
  Administered 2017-07-25: 4 [IU] via SUBCUTANEOUS
  Administered 2017-07-25: 2 [IU] via SUBCUTANEOUS

## 2017-07-24 MED ORDER — MILRINONE LACTATE IN DEXTROSE 20-5 MG/100ML-% IV SOLN
0.0000 ug/kg/min | INTRAVENOUS | Status: DC
  Administered 2017-07-25: 0.3 ug/kg/min via INTRAVENOUS
  Filled 2017-07-24: qty 100

## 2017-07-24 MED ORDER — SODIUM CHLORIDE 0.9 % IV SOLN: INTRAVENOUS | Status: DC

## 2017-07-24 MED ORDER — SODIUM CHLORIDE 0.9 % IV SOLN: 250.0000 mL | INTRAVENOUS | Status: DC

## 2017-07-24 MED ORDER — DEXTROSE 5 % IV SOLN
1.5000 g | Freq: Two times a day (BID) | INTRAVENOUS | Status: AC
  Administered 2017-07-24 – 2017-07-26 (×4): 1.5 g via INTRAVENOUS
  Filled 2017-07-24 (×4): qty 1.5

## 2017-07-24 MED ORDER — MILRINONE LACTATE IN DEXTROSE 20-5 MG/100ML-% IV SOLN
0.1250 ug/kg/min | INTRAVENOUS | Status: AC
  Administered 2017-07-24: .3 ug/kg/min via INTRAVENOUS
  Filled 2017-07-24: qty 100

## 2017-07-24 MED ORDER — ACETAMINOPHEN 160 MG/5ML PO SOLN: 1000.0000 mg | Freq: Four times a day (QID) | ORAL | Status: DC

## 2017-07-24 MED ORDER — CHLORHEXIDINE GLUCONATE 0.12 % MT SOLN
OROMUCOSAL | Status: AC
  Administered 2017-07-24: 15 mL via OROMUCOSAL
  Filled 2017-07-24: qty 15

## 2017-07-24 MED ORDER — 0.9 % SODIUM CHLORIDE (POUR BTL) OPTIME
TOPICAL | Status: DC | PRN
Start: 2017-07-24 — End: 2017-07-24
  Administered 2017-07-24: 6000 mL

## 2017-07-24 MED ORDER — MAGNESIUM SULFATE 4 GM/100ML IV SOLN
4.0000 g | Freq: Once | INTRAVENOUS | Status: AC
  Administered 2017-07-24: 4 g via INTRAVENOUS
  Filled 2017-07-24: qty 100

## 2017-07-24 MED ORDER — ASPIRIN EC 325 MG PO TBEC
325.0000 mg | DELAYED_RELEASE_TABLET | Freq: Every day | ORAL | Status: DC
  Administered 2017-07-25: 325 mg via ORAL
  Filled 2017-07-24: qty 1

## 2017-07-24 MED ORDER — TRAMADOL HCL 50 MG PO TABS
50.0000 mg | ORAL_TABLET | ORAL | Status: DC | PRN
  Administered 2017-07-25 – 2017-07-26 (×2): 100 mg via ORAL
  Filled 2017-07-24 (×2): qty 2

## 2017-07-24 MED ORDER — ASPIRIN 81 MG PO CHEW: 324.0000 mg | CHEWABLE_TABLET | Freq: Every day | ORAL | Status: DC

## 2017-07-24 MED ORDER — CHLORHEXIDINE GLUCONATE 0.12 % MT SOLN
15.0000 mL | Freq: Once | OROMUCOSAL | Status: AC
  Administered 2017-07-24: 15 mL via OROMUCOSAL

## 2017-07-24 MED ORDER — ORAL CARE MOUTH RINSE
15.0000 mL | OROMUCOSAL | Status: DC
  Administered 2017-07-24: 15 mL via OROMUCOSAL

## 2017-07-24 MED ORDER — SODIUM CHLORIDE 0.45 % IV SOLN
INTRAVENOUS | Status: DC | PRN
  Administered 2017-07-24: 15:00:00 via INTRAVENOUS

## 2017-07-24 MED ORDER — LACTATED RINGERS IV SOLN
INTRAVENOUS | Status: DC | PRN
  Administered 2017-07-24 (×2): via INTRAVENOUS

## 2017-07-24 MED ORDER — PROPOFOL 10 MG/ML IV BOLUS
INTRAVENOUS | Status: DC | PRN
  Administered 2017-07-24: 30 mg via INTRAVENOUS

## 2017-07-24 MED ORDER — LACTATED RINGERS IV SOLN: 500.0000 mL | Freq: Once | INTRAVENOUS | Status: DC | PRN

## 2017-07-24 MED ORDER — CHLORHEXIDINE GLUCONATE 0.12% ORAL RINSE (MEDLINE KIT)
15.0000 mL | Freq: Two times a day (BID) | OROMUCOSAL | Status: DC
  Administered 2017-07-24: 15 mL via OROMUCOSAL

## 2017-07-24 MED ORDER — DOCUSATE SODIUM 100 MG PO CAPS
200.0000 mg | ORAL_CAPSULE | Freq: Every day | ORAL | Status: DC
  Administered 2017-07-25 – 2017-07-26 (×2): 200 mg via ORAL
  Filled 2017-07-24 (×3): qty 2

## 2017-07-24 MED ORDER — ONDANSETRON HCL 4 MG/2ML IJ SOLN
4.0000 mg | Freq: Four times a day (QID) | INTRAMUSCULAR | Status: DC | PRN
  Administered 2017-07-25: 4 mg via INTRAVENOUS
  Filled 2017-07-24: qty 2

## 2017-07-24 MED ORDER — ACETAMINOPHEN 160 MG/5ML PO SOLN: 650.0000 mg | Freq: Once | ORAL | Status: AC

## 2017-07-24 MED ORDER — METOPROLOL TARTRATE 12.5 MG HALF TABLET
12.5000 mg | ORAL_TABLET | Freq: Two times a day (BID) | ORAL | Status: DC
  Administered 2017-07-25: 12.5 mg via ORAL
  Filled 2017-07-24 (×2): qty 1

## 2017-07-24 MED ORDER — PHENYLEPHRINE 40 MCG/ML (10ML) SYRINGE FOR IV PUSH (FOR BLOOD PRESSURE SUPPORT)
PREFILLED_SYRINGE | INTRAVENOUS | Status: DC | PRN
  Administered 2017-07-24: 80 ug via INTRAVENOUS

## 2017-07-24 MED ORDER — FENTANYL CITRATE (PF) 250 MCG/5ML IJ SOLN
INTRAMUSCULAR | Status: DC | PRN
  Administered 2017-07-24: 150 ug via INTRAVENOUS
  Administered 2017-07-24: 100 ug via INTRAVENOUS
  Administered 2017-07-24: 150 ug via INTRAVENOUS
  Administered 2017-07-24 (×2): 100 ug via INTRAVENOUS
  Administered 2017-07-24: 50 ug via INTRAVENOUS
  Administered 2017-07-24: 150 ug via INTRAVENOUS
  Administered 2017-07-24: 400 ug via INTRAVENOUS
  Administered 2017-07-24: 50 ug via INTRAVENOUS

## 2017-07-24 MED ORDER — MIDAZOLAM HCL 5 MG/5ML IJ SOLN
INTRAMUSCULAR | Status: DC | PRN
  Administered 2017-07-24: 2 mg via INTRAVENOUS
  Administered 2017-07-24: 1 mg via INTRAVENOUS
  Administered 2017-07-24: 3 mg via INTRAVENOUS
  Administered 2017-07-24 (×2): 1 mg via INTRAVENOUS
  Administered 2017-07-24: 2 mg via INTRAVENOUS

## 2017-07-24 MED ORDER — BUPIVACAINE HCL (PF) 0.5 % IJ SOLN
INTRAMUSCULAR | Status: AC
  Filled 2017-07-24: qty 10

## 2017-07-24 MED ORDER — BISACODYL 5 MG PO TBEC
10.0000 mg | DELAYED_RELEASE_TABLET | Freq: Every day | ORAL | Status: DC
  Administered 2017-07-25 – 2017-07-26 (×2): 10 mg via ORAL
  Filled 2017-07-24 (×3): qty 2

## 2017-07-24 MED ORDER — SODIUM CHLORIDE 0.9 % IV SOLN
0.0000 ug/kg/h | INTRAVENOUS | Status: DC
  Administered 2017-07-24: 0.1 ug/kg/h via INTRAVENOUS
  Filled 2017-07-24: qty 2

## 2017-07-24 MED ORDER — CHLORHEXIDINE GLUCONATE 0.12 % MT SOLN
15.0000 mL | OROMUCOSAL | Status: AC
  Administered 2017-07-24: 15 mL via OROMUCOSAL

## 2017-07-24 MED ORDER — SODIUM CHLORIDE 0.9% FLUSH: 3.0000 mL | INTRAVENOUS | Status: DC | PRN

## 2017-07-24 MED ORDER — SODIUM CHLORIDE 0.9% FLUSH: 10.0000 mL | Freq: Two times a day (BID) | INTRAVENOUS | Status: DC

## 2017-07-24 MED ORDER — PANTOPRAZOLE SODIUM 40 MG PO TBEC
40.0000 mg | DELAYED_RELEASE_TABLET | Freq: Every day | ORAL | Status: DC
  Administered 2017-07-26 – 2017-07-28 (×3): 40 mg via ORAL
  Filled 2017-07-24 (×3): qty 1

## 2017-07-24 MED ORDER — METOPROLOL TARTRATE 12.5 MG HALF TABLET
12.5000 mg | ORAL_TABLET | Freq: Once | ORAL | Status: AC
Start: 2017-07-24 — End: 2017-07-24
  Administered 2017-07-24: 12.5 mg via ORAL

## 2017-07-24 MED ORDER — BUPIVACAINE HCL (PF) 0.5 % IJ SOLN
INTRAMUSCULAR | Status: DC | PRN
  Administered 2017-07-24: 5 mL

## 2017-07-24 MED ORDER — SODIUM CHLORIDE 0.9 % IV SOLN
INTRAVENOUS | Status: DC
  Filled 2017-07-24: qty 1

## 2017-07-24 MED ORDER — PROPOFOL 10 MG/ML IV BOLUS
INTRAVENOUS | Status: AC
  Filled 2017-07-24: qty 20

## 2017-07-24 MED ORDER — CHLORHEXIDINE GLUCONATE CLOTH 2 % EX PADS
6.0000 | MEDICATED_PAD | Freq: Every day | CUTANEOUS | Status: DC
  Administered 2017-07-24 – 2017-07-26 (×3): 6 via TOPICAL

## 2017-07-24 MED ORDER — OXYCODONE HCL 5 MG PO TABS
5.0000 mg | ORAL_TABLET | ORAL | Status: DC | PRN
  Administered 2017-07-25 (×2): 10 mg via ORAL
  Filled 2017-07-24 (×2): qty 2

## 2017-07-24 MED ORDER — MORPHINE SULFATE (PF) 4 MG/ML IV SOLN: 1.0000 mg | INTRAVENOUS | Status: DC | PRN

## 2017-07-24 MED ORDER — FENTANYL CITRATE (PF) 250 MCG/5ML IJ SOLN
INTRAMUSCULAR | Status: AC
  Filled 2017-07-24: qty 25

## 2017-07-24 MED ORDER — ACETAMINOPHEN 650 MG RE SUPP
650.0000 mg | Freq: Once | RECTAL | Status: AC
  Administered 2017-07-24: 650 mg via RECTAL

## 2017-07-24 MED ORDER — MIDAZOLAM HCL 10 MG/2ML IJ SOLN
INTRAMUSCULAR | Status: AC
  Filled 2017-07-24: qty 2

## 2017-07-24 MED ORDER — SODIUM CHLORIDE 0.9 % IR SOLN
Status: DC | PRN
  Administered 2017-07-24: 1000 mL

## 2017-07-24 MED ORDER — MORPHINE SULFATE (PF) 4 MG/ML IV SOLN
1.0000 mg | INTRAVENOUS | Status: DC | PRN
  Filled 2017-07-24: qty 1

## 2017-07-24 MED ORDER — SODIUM CHLORIDE 0.9 % IV SOLN
0.0000 ug/min | INTRAVENOUS | Status: DC
  Filled 2017-07-24: qty 2

## 2017-07-24 MED ORDER — METOPROLOL TARTRATE 5 MG/5ML IV SOLN
2.5000 mg | INTRAVENOUS | Status: DC | PRN
  Administered 2017-07-28: 2.5 mg via INTRAVENOUS
  Filled 2017-07-24: qty 5

## 2017-07-24 MED FILL — Potassium Chloride Inj 2 mEq/ML: INTRAVENOUS | Qty: 10 | Status: AC

## 2017-07-24 MED FILL — Mannitol IV Soln 20%: INTRAVENOUS | Qty: 1000 | Status: AC

## 2017-07-24 MED FILL — Sodium Chloride IV Soln 0.9%: INTRAVENOUS | Qty: 3000 | Status: AC

## 2017-07-24 MED FILL — Heparin Sodium (Porcine) Inj 1000 Unit/ML: INTRAMUSCULAR | Qty: 30 | Status: AC

## 2017-07-24 MED FILL — Electrolyte-R (PH 7.4) Solution: INTRAVENOUS | Qty: 4000 | Status: AC

## 2017-07-24 MED FILL — Magnesium Sulfate Inj 50%: INTRAMUSCULAR | Qty: 10 | Status: AC

## 2017-07-24 MED FILL — Sodium Bicarbonate IV Soln 8.4%: INTRAVENOUS | Qty: 50 | Status: AC

## 2017-07-24 MED FILL — Lidocaine HCl IV Inj 20 MG/ML: INTRAVENOUS | Qty: 25 | Status: AC

## 2017-07-24 MED FILL — Heparin Sodium (Porcine) Inj 1000 Unit/ML: INTRAMUSCULAR | Qty: 60 | Status: AC

## 2017-07-24 SURGICAL SUPPLY — 100 items
ADAPTER CARDIO PERF ANTE/RETRO (ADAPTER) ×3 IMPLANT
BAG DECANTER FOR FLEXI CONT (MISCELLANEOUS) ×3 IMPLANT
BLADE SURG 11 STRL SS (BLADE) ×3 IMPLANT
CANISTER SUCT 3000ML PPV (MISCELLANEOUS) ×3 IMPLANT
CANNULA FEM VENOUS REMOTE 22FR (CANNULA) ×3 IMPLANT
CANNULA FEMORAL ART 14 SM (MISCELLANEOUS) ×3 IMPLANT
CANNULA GUNDRY RCSP 15FR (MISCELLANEOUS) ×3 IMPLANT
CANNULA OPTISITE PERFUSION 16F (CANNULA) IMPLANT
CANNULA OPTISITE PERFUSION 18F (CANNULA) ×3 IMPLANT
CANNULA SUMP PERICARDIAL (CANNULA) ×3 IMPLANT
CATH KIT ON Q 5IN SLV (PAIN MANAGEMENT) IMPLANT
CATH KIT ON-Q SILVERSOAK 5IN (CATHETERS) ×3 IMPLANT
CELLS DAT CNTRL 66122 CELL SVR (MISCELLANEOUS) ×2 IMPLANT
CONN ST 1/4X3/8  BEN (MISCELLANEOUS) ×2
CONN ST 1/4X3/8 BEN (MISCELLANEOUS) ×4 IMPLANT
CONNECTOR 1/2X3/8X1/2 3 WAY (MISCELLANEOUS) ×1
CONNECTOR 1/2X3/8X1/2 3WAY (MISCELLANEOUS) ×2 IMPLANT
CONT SPEC 4OZ CLIKSEAL STRL BL (MISCELLANEOUS) ×6 IMPLANT
COVER BACK TABLE 24X17X13 BIG (DRAPES) ×3 IMPLANT
COVER MAYO STAND STRL (DRAPES) ×3 IMPLANT
COVER PROBE W GEL 5X96 (DRAPES) ×3 IMPLANT
CRADLE DONUT ADULT HEAD (MISCELLANEOUS) ×3 IMPLANT
DERMABOND ADVANCED (GAUZE/BANDAGES/DRESSINGS) ×2
DERMABOND ADVANCED .7 DNX12 (GAUZE/BANDAGES/DRESSINGS) ×4 IMPLANT
DEVICE PMI PUNCTURE CLOSURE (MISCELLANEOUS) ×3 IMPLANT
DEVICE SUT CK QUICK LOAD INDV (Prosthesis & Implant Heart) ×6 IMPLANT
DEVICE SUT CK QUICK LOAD MINI (Prosthesis & Implant Heart) ×3 IMPLANT
DEVICE TROCAR PUNCTURE CLOSURE (ENDOMECHANICALS) ×3 IMPLANT
DRAIN CHANNEL 28F RND 3/8 FF (WOUND CARE) ×6 IMPLANT
DRAPE BILATERAL SPLIT (DRAPES) ×3 IMPLANT
DRAPE C-ARM 42X72 X-RAY (DRAPES) ×3 IMPLANT
DRAPE CV SPLIT W-CLR ANES SCRN (DRAPES) ×3 IMPLANT
DRAPE INCISE IOBAN 66X45 STRL (DRAPES) ×6 IMPLANT
DRAPE SLUSH/WARMER DISC (DRAPES) ×3 IMPLANT
DRSG COVADERM 4X8 (GAUZE/BANDAGES/DRESSINGS) ×3 IMPLANT
ELECT BLADE 6.5 EXT (BLADE) ×6 IMPLANT
ELECT REM PT RETURN 9FT ADLT (ELECTROSURGICAL) ×6
ELECTRODE REM PT RTRN 9FT ADLT (ELECTROSURGICAL) ×4 IMPLANT
FELT TEFLON 1X6 (MISCELLANEOUS) ×3 IMPLANT
FEMORAL VENOUS CANN RAP (CANNULA) IMPLANT
GAUZE SPONGE 4X4 12PLY STRL LF (GAUZE/BANDAGES/DRESSINGS) ×3 IMPLANT
GLOVE ORTHO TXT STRL SZ7.5 (GLOVE) ×9 IMPLANT
GOWN STRL REUS W/ TWL LRG LVL3 (GOWN DISPOSABLE) ×20 IMPLANT
GOWN STRL REUS W/TWL LRG LVL3 (GOWN DISPOSABLE) ×10
IV SOD CHL 0.9% 1000ML (IV SOLUTION) ×3 IMPLANT
KIT BASIN OR (CUSTOM PROCEDURE TRAY) ×3 IMPLANT
KIT DILATOR VASC 18G NDL (KITS) ×3 IMPLANT
KIT DRAINAGE VACCUM ASSIST (KITS) ×3 IMPLANT
KIT ROOM TURNOVER OR (KITS) ×3 IMPLANT
KIT SUCTION CATH 14FR (SUCTIONS) ×3 IMPLANT
KIT SUT CK MINI COMBO 4X17 (Prosthesis & Implant Heart) ×3 IMPLANT
LEAD PACING MYOCARDI (MISCELLANEOUS) ×3 IMPLANT
LINE VENT (MISCELLANEOUS) ×9 IMPLANT
NEEDLE AORTIC ROOT 14G 7F (CATHETERS) ×3 IMPLANT
NS IRRIG 1000ML POUR BTL (IV SOLUTION) ×18 IMPLANT
PACK OPEN HEART (CUSTOM PROCEDURE TRAY) ×3 IMPLANT
PAD ARMBOARD 7.5X6 YLW CONV (MISCELLANEOUS) ×6 IMPLANT
PAD ELECT DEFIB RADIOL ZOLL (MISCELLANEOUS) ×3 IMPLANT
PENCIL BUTTON HOLSTER BLD 10FT (ELECTRODE) ×3 IMPLANT
RING MITRAL MEMO 3D 32MM SMD32 (Prosthesis & Implant Heart) ×3 IMPLANT
RTRCTR WOUND ALEXIS 18CM MED (MISCELLANEOUS) ×3
SET CANNULATION TOURNIQUET (MISCELLANEOUS) ×3 IMPLANT
SET CARDIOPLEGIA MPS 5001102 (MISCELLANEOUS) ×3 IMPLANT
SET IRRIG TUBING LAPAROSCOPIC (IRRIGATION / IRRIGATOR) ×3 IMPLANT
SOLUTION ANTI FOG 6CC (MISCELLANEOUS) ×3 IMPLANT
SPONGE GAUZE 4X4 12PLY STER LF (GAUZE/BANDAGES/DRESSINGS) ×3 IMPLANT
SUT BONE WAX W31G (SUTURE) ×3 IMPLANT
SUT E-PACK MINIMALLY INVASIVE (SUTURE) ×3 IMPLANT
SUT ETHIBOND (SUTURE) ×6 IMPLANT
SUT ETHIBOND 2 0 SH (SUTURE) ×3 IMPLANT
SUT ETHIBOND 2-0 RB-1 WHT (SUTURE) ×6 IMPLANT
SUT ETHIBOND X763 2 0 SH 1 (SUTURE) ×3 IMPLANT
SUT GORETEX CV 4 TH 22 36 (SUTURE) ×3 IMPLANT
SUT GORETEX CV-5THC-13 36IN (SUTURE) ×27 IMPLANT
SUT GORETEX CV4 TH-18 (SUTURE) ×6 IMPLANT
SUT PROLENE 3 0 SH1 36 (SUTURE) ×12 IMPLANT
SUT PROLENE 4 0 RB 1 (SUTURE) ×1
SUT PROLENE 4-0 RB1 .5 CRCL 36 (SUTURE) ×2 IMPLANT
SUT PTFE CHORD X 24MM (SUTURE) ×3 IMPLANT
SUT PTFE CHORD X SYSTEM (SUTURE) ×3 IMPLANT
SUT SILK  1 MH (SUTURE) ×2
SUT SILK 1 MH (SUTURE) ×4 IMPLANT
SUT SILK 2 0 SH CR/8 (SUTURE) IMPLANT
SUT SILK 3 0 SH CR/8 (SUTURE) IMPLANT
SUT VIC AB 2-0 CTX 36 (SUTURE) IMPLANT
SUT VIC AB 3-0 SH 8-18 (SUTURE) ×3 IMPLANT
SUT VICRYL 2 TP 1 (SUTURE) IMPLANT
SYR 10ML LL (SYRINGE) ×3 IMPLANT
SYSTEM SAHARA CHEST DRAIN ATS (WOUND CARE) ×3 IMPLANT
TAPE CLOTH SURG 4X10 WHT LF (GAUZE/BANDAGES/DRESSINGS) ×3 IMPLANT
TAPE PAPER 2X10 WHT MICROPORE (GAUZE/BANDAGES/DRESSINGS) ×3 IMPLANT
TOWEL GREEN STERILE (TOWEL DISPOSABLE) ×3 IMPLANT
TRAY FOLEY SILVER 16FR TEMP (SET/KITS/TRAYS/PACK) ×3 IMPLANT
TROCAR XCEL BLADELESS 5X75MML (TROCAR) ×3 IMPLANT
TROCAR XCEL NON-BLD 11X100MML (ENDOMECHANICALS) ×6 IMPLANT
TUBE SUCT INTRACARD DLP 20F (MISCELLANEOUS) ×3 IMPLANT
TUNNELER SHEATH ON-Q 11GX8 DSP (PAIN MANAGEMENT) IMPLANT
UNDERPAD 30X30 (UNDERPADS AND DIAPERS) ×3 IMPLANT
WATER STERILE IRR 1000ML POUR (IV SOLUTION) ×6 IMPLANT
WIRE .035 3MM-J 145CM (WIRE) ×3 IMPLANT

## 2017-07-24 NOTE — Op Note (Signed)
CARDIOTHORACIC SURGERY OPERATIVE NOTE  Date of Procedure:  07/24/2017  Preoperative Diagnosis: Severe Mitral Regurgitation  Postoperative Diagnosis: Same  Procedure:    Minimally-Invasive Mitral Valve Repair  Complex valvuloplasty including triangular resection of flail segment (P2) of posterior leaflet  Artificial Gore-tex neochord placement x4  Sorin Memo 3D Ring Annuloplasty (size 32mm, catalog # [id number], serial # [id number])    Surgeon: Salvatore Decentlarence H. Cornelius Moraswen, MD  Assistant: Jari Favreessa Conte, PA-C  Anesthesia: Germaine PomfretE. 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 The patient states that he has been treated for hypertension for many years. He has remained physically active and without significant limitations until fairly recently. Approximately one year ago he began to experience symptoms of exertional shortness of breath and fatigue. He was noted to have a heart murmur on routine physical exam by his primary care physician and transthoracic echocardiogram revealed mitral valve prolapse with severe mitral regurgitation and normal left ventricular systolic function. The patient was evaluated by Dr. Tomie Chinaevankar and referred to [facility] Sil[facility]lower [facility]. The patient was scheduled for elective outpatient cardiac catheterization and surgery, but the  patient was subsequently informed [facility] did not [facility] the patient's healthcare insurance. The patient was subsequently referred for elective second opinion consultation. The patient has been seen in consultation and counseled at length regarding the indications, risks and potential benefits of surgery.  All questions have been answered, and the patient provides full informed consent for the operation as described.    DETAILS OF THE OPERATIVE PROCEDURE  Preparation:  The patient is brought to the operating room on the above mentioned date and central monitoring was established by the anesthesia team including placement of Swan-Ganz catheter through the left internal jugular vein.  A radial arterial line is placed. The patient is placed in the supine position on the operating table.  Intravenous antibiotics are administered. General endotracheal anesthesia is induced uneventfully. The patient is initially intubated using a dual lumen endotracheal tube.  A Foley catheter is placed.  Baseline transesophageal echocardiogram was performed.  Findings were notable for myxomatous degenerative disease of the mitral valve with an obvious large flail segment involving the middle scallop of the posterior leaflet. There were multiple ruptured primary chordae tendineae. There was severe mitral regurgitation. Left ventricular function appeared normal. There was mild aortic insufficiency. The jet of aortic insufficiency was eccentric and directed along the anterior leaflet of the mitral valve.  A soft roll is placed behind the patient's left scapula and the neck gently extended and turned to the left.   The patient's right neck, chest, abdomen, both groins, and both lower extremities are prepared and draped in a sterile manner. A time out procedure is performed.  Surgical Approach:  A right miniature anterolateral thoracotomy incision is performed. The incision is placed just lateral to  and superior to the right nipple. The pectoralis major  muscle is retracted medially and completely preserved. The right pleural space is entered through the 3rd intercostal space. A soft tissue retractor is placed.  Two 11 mm ports are placed through separate stab incisions inferiorly. The right pleural space is insufflated continuously with carbon dioxide gas through the posterior port during the remainder of the operation.  A pledgeted sutures placed through the dome of the right hemidiaphragm and retracted inferiorly to facilitate exposure.  A longitudinal incision is made in the pericardium 3 cm anterior to the phrenic nerve and silk traction sutures are placed on either side of the incision for exposure.   Extracorporeal Cardiopulmonary Bypass and Myocardial Protection:  A small incision is made in the right inguinal crease and the anterior surface of the right common femoral artery and right common femoral vein are identified.  The patient is placed in Trendelenburg position. The right internal jugular vein is cannulated with Seldinger technique and a guidewire advanced into the right atrium. The patient is heparinized systemically. The right internal jugular vein is cannulated with a 14 [place]French pediatric femoral venous cannula. Pursestring sutures are placed on the anterior surface of the right common femoral vein and right common femoral artery. The right common femoral vein is cannulated with the Seldinger technique and a guidewire is advanced under transesophageal echocardiogram guidance through the right atrium. The femoral vein is cannulated with a long 22 French femoral venous cannula. The right common femoral artery is cannulated with Seldinger technique and a flexible guidewire is advanced until it can be appreciated intraluminally in the descending thoracic aorta on transesophageal echocardiogram. The femoral artery is cannulated with an 18 French femoral arterial cannula.  Adequate  heparinization is verified.     The entire pre-bypass portion of the operation was notable for stable hemodynamics although the patient developed atrial fibrillation.  Cardiopulmonary bypass was begun.  Vacuum assist venous drainage is utilized. The incision in the pericardium is extended in both directions. Venous drainage and exposure are notably excellent. A retrograde cardioplegia cannula is placed through the right atrium into the coronary sinus using transesophageal echocardiogram guidance.  An antegrade cardioplegia cannula is placed in the ascending aorta.    The patient is cooled to 28C systemic temperature. The aortic cross clamp is applied and cardioplegia is delivered initially in an antegrade fashion through the aortic root using modified [place] cold blood cardioplegia ([place]estone blood cardioplegia protocol).   The initial cardioplegic arrest is rapid with early diastolic arrest.  Repeat doses of cardioplegia are administered at 90 minutes and every 30 minutes thereafter through the coronary sinus catheter in order to maintain completely flat electrocardiogram.  Myocardial protection was felt to be excellent.     Mitral Valve Repair:  A left atriotomy incision was performed through the interatrial groove and extended partially across the back wall of the left atrium after opening the oblique sinus inferiorly.  The mitral valve is exposed using a self-retaining retractor.  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 There was a large redundant thickened middle scallop of the posterior leaflet (P2) with multiple ruptured primary chordae tendineae and several elongated chordae tendineae.  There was moderate thickening and some calcification in the posterior annulus. There was mild thickening of the anterior leaflet, the P1 and P3 segments of the posterior leaflet.  Interrupted 2-0 Ethibond horizontal mattress  sutures are placed circumferentially around the entire mitral valve annulus. The sutures will ultimately be utilized for ring  annuloplasty, and at this juncture there are utilized to suspend the valve symmetrically.  A large redundant flail segment of the posterior leaflet (P2) was repaired using a simple triangular resection which was fashioned to decrease the height. The intervening vertical defect was closed using interrupted everting simple CV 5 Gore-Tex suture.  Artificial neochord placement was performed using Chord-X multi-strand CV-4 Goretex pre-measured loops.  The appropriate cord length was measured from corresponding normal length primary cords from the P3 segment of the posterior leaflet. The papillary muscle suture of the Chord-X multi-strand suture was placed through the head of the posterior papillary muscle in a horizontal mattress fashion and tied over Teflon felt pledgets. Two of the three pre-measured loops were then reimplanted into the free margin of the P2 segment of the posterior leaflet.  A third loop was discarded.  The valve was tested with saline and appeared competent even without ring annuloplasty complete. The valve was sized to a 32 mm annuloplasty ring, based upon the transverse distance between the left and right commissures and the height of the anterior leaflet, corresponding to a size just slightly larger than the overall surface area of the anterior leaflet.  A Sorin Memo 3D annuloplasty ring (size 32mm, catalog [id number], serial [id number]) was secured in place uneventfully. All ring sutures were secured using a Cor-knot device.    The valve was tested with saline and appeared competent. There is no residual leak. There was a broad, symmetrical line of coaptation of the anterior and posterior leaflet which was confirmed using the blue ink test.  Rewarming is begun.   Procedure Completion:  The atriotomy was closed using a 2-layer closure of running 3-0 Prolene suture after  placing a sump drain across the mitral valve to serve as a left ventricular vent.  One final dose of warm retrograde "reanimation dose" cardioplegia was administered retrograde through the coronary sinus catheter while all air was evacuated through the aortic root.  The aortic cross clamp was removed after a total cross clamp time of 114 minutes.  Epicardial pacing wires are fixed to the inferior wall of the right ventricule and to the right atrial appendage. The patient is rewarmed to 37C temperature. The left ventricular vent is removed.  The patient is ventilated and flow volumes turndown while the mitral valve repair is inspected using transesophageal echocardiogram. The valve repair appears intact with no residual leak. The antegrade cardioplegia cannula is now removed. The patient is weaned and disconnected from cardiopulmonary bypass.  The patient's rhythm at separation from bypass was atrial paced.  The patient was weaned from bypass without any inotropic support. Total cardiopulmonary bypass time for the operation was 156 minutes.  Followup transesophageal echocardiogram performed after separation from bypass revealed a well-seated annuloplasty ring in the mitral position with a normal functioning mitral valve. There was no residual leak.  Left ventricular function was unchanged from preoperatively.  The mean gradient across the mitral valve was estimated to be 1 mmHg.  The femoral arterial and venous cannulae were removed uneventfully. There was a palpable pulse in the distal right common femoral artery after removal of the cannula. Protamine was administered to reverse the anticoagulation. The right internal jugular cannula was removed and manual pressure held on the neck for 15 minutes.  Single lung ventilation was begun. The atriotomy closure was inspected for hemostasis. The pericardial sac was drained using a 28 French Bard drain placed through the anterior port incision.   The right pleural  space is irrigated  with saline solution and inspected for hemostasis. An On-Q catheter was tunneled to the subcutaneous tissues to the posterior port incision and subsequently tunneled through the chest wall into the subpleural space posteriorly on the right side to cover the second through the sixth intercostal nerve roots. The catheter was flushed with 0.5% bupivacaine solution and ultimately connected to a continuous infusion pump. The right pleural space was drained using a 28 French Bard drain placed through the posterior port incision. The miniature thoracotomy incision was closed in multiple layers in routine fashion. The right groin incision was inspected for hemostasis and closed in multiple layers in routine fashion.  The post-bypass portion of the operation was notable for stable rhythm and hemodynamics.  No blood products were administered during the operation.   Disposition:  The patient tolerated the procedure well.  The patient was reintubated using a single lumen endotracheal tube and subsequently transported to the surgical intensive care unit in stable condition. There were no intraoperative complications. All sponge instrument and needle counts are verified correct at completion of the operation.     Salvatore Decent. Cornelius Moras MD 07/24/2017 2:28 PM

## 2017-07-24 NOTE — Progress Notes (Signed)
  Echocardiogram Echocardiogram Transesophageal has been performed.  Tye SavoyCasey N Bassel Gaskill 07/24/2017, 10:02 AM

## 2017-07-24 NOTE — Interval H&P Note (Signed)
History and Physical Interval Note:  07/24/2017 7:09 AM  Craig Griffin  has presented today for surgery, with the diagnosis of mitral regurgitation  The various methods of treatment have been discussed with the patient and family. After consideration of risks, benefits and other options for treatment, the patient has consented to  Procedure(s): MINIMALLY INVASIVE MITRAL VALVE REPAIR (MVR) (Right) TRANSESOPHAGEAL ECHOCARDIOGRAM (TEE) (N/A) as a surgical intervention .  The patient's history has been reviewed, patient examined, no change in status, stable for surgery.  I have reviewed the patient's chart and labs.  Questions were answered to the patient's satisfaction.     Purcell Nailslarence H Clothilde Tippetts

## 2017-07-24 NOTE — Anesthesia Procedure Notes (Signed)
Procedure Name: Intubation Date/Time: 07/24/2017 9:03 AM Performed by: Clearnce Sorrel Pre-anesthesia Checklist: Patient identified, Emergency Drugs available, Suction available, Patient being monitored and Timeout performed Patient Re-evaluated:Patient Re-evaluated prior to induction Oxygen Delivery Method: Circle system utilized Preoxygenation: Pre-oxygenation with 100% oxygen Induction Type: IV induction Ventilation: Mask ventilation without difficulty, Oral airway inserted - appropriate to patient size and Two handed mask ventilation required Laryngoscope Size: Mac and 3 Grade View: Grade I Tube type: Oral Endobronchial tube: Double lumen EBT and Left and 39 Fr Number of attempts: 1 Airway Equipment and Method: Stylet Placement Confirmation: ETT inserted through vocal cords under direct vision,  positive ETCO2 and breath sounds checked- equal and bilateral Tube secured with: Tape Dental Injury: Teeth and Oropharynx as per pre-operative assessment

## 2017-07-24 NOTE — Brief Op Note (Addendum)
07/24/2017  12:50 PM  PATIENT:  Craig Griffin  66 y.o. 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

## 2017-07-24 NOTE — Anesthesia Procedure Notes (Signed)
Arterial Line Insertion Start/End8/12/2016 8:06 AM, 07/24/2017 8:06 AM Performed by: CRNA  Patient location: Pre-op. Preanesthetic checklist: patient identified, IV checked, site marked, risks and benefits discussed, surgical consent, monitors and equipment checked, pre-op evaluation, timeout performed and anesthesia consent Lidocaine 1% used for infiltration Left, radial was placed Catheter size: 20 Fr Hand hygiene performed  and maximum sterile barriers used   Attempts: 1 Procedure performed without using ultrasound guided technique. Following insertion, dressing applied. Post procedure assessment: normal and unchanged

## 2017-07-24 NOTE — Progress Notes (Signed)
Patient ID: Craig Griffin, male   DOB: 1951/03/20, 66 y.o.   MRN: [id number] EVENING ROUNDS NOTE :     [address]       [place] [id number]             [phone]                 Day of Surgery Procedure(s) (LRB): MINIMALLY INVASIVE MITRAL VALVE REPAIR (MVR) (Right) TRANSESOPHAGEAL ECHOCARDIOGRAM (TEE) (N/A)  Total Length of Stay:  LOS: 0 days  BP 122/81 (BP Location: Right Arm)   Pulse 82   Temp 98.1 F (36.7 C)   Resp 16   Ht 5\' 7"  (1.702 m)   Wt 238 lb (108 kg)   SpO2 95%   BMI 37.28 kg/m   .Intake/Output      07/31 0701 - 08/01 0700 08/01 0701 - 08/02 0700   I.V. (mL/kg)  3041.4 (28.2)   Blood  660   IV Piggyback  550   Total Intake(mL/kg)  4251.4 (39.4)   Urine (mL/kg/hr)  900 (0.8)   Blood  1300   Total Output   2200   Net   +2051.4          . sodium chloride 10 mL/hr at 07/24/17 1700  . sodium chloride 100 mL/hr at 07/24/17 1700  . [START ON 07/25/2017] sodium chloride    . sodium chloride 10 mL/hr at 07/24/17 1700  . albumin human    . cefUROXime (ZINACEF)  IV    . dexmedetomidine (PRECEDEX) IV infusion 0.5 mcg/kg/hr (07/24/17 1505)  . famotidine (PEPCID) IV 20 mg (07/24/17 1505)  . insulin (NOVOLIN-R) infusion 0.8 Units/hr (07/24/17 1600)  . lactated ringers    . lactated ringers 10 mL/hr at 07/24/17 1700  . lactated ringers 10 mL/hr at 07/24/17 1700  . magnesium sulfate 4 g (07/24/17 1600)  . milrinone 0.3 mcg/kg/min (07/24/17 1505)  . nitroGLYCERIN Stopped (07/24/17 1600)  . phenylephrine (NEO-SYNEPHRINE) Adult infusion 10 mcg/min (07/24/17 1505)  . vancomycin       Lab Results  Component Value Date   WBC 11.7 (H) 07/24/2017   HGB 11.5 (L) 07/24/2017   HCT 33.5 (L) 07/24/2017   PLT 111 (L) 07/24/2017   GLUCOSE 150 (H) 07/24/2017   ALT 29 07/22/2017   AST 29 07/22/2017   NA 140 07/24/2017   K 5.3 (H) 07/24/2017   CL 104 07/24/2017   CREATININE 1.00 07/24/2017   BUN 19 07/24/2017   CO2 25 07/22/2017   INR 1.36  07/24/2017   HGBA1C 5.5 07/22/2017   Still asleep Not bleeding Hemodynamics stable   Delight OvensEdward B Essense Bousquet MD  Beeper [phone] Office [phone] 07/24/2017 6:01 PM

## 2017-07-24 NOTE — Transfer of Care (Signed)
Immediate Anesthesia Transfer of Care Note  Patient: Craig Griffin  Procedure(s) Performed: Procedure(s): MINIMALLY INVASIVE MITRAL VALVE REPAIR (MVR) (Right) TRANSESOPHAGEAL ECHOCARDIOGRAM (TEE) (N/A)  Patient Location: SICU  Anesthesia Type:General  Level of Consciousness: Patient remains intubated per anesthesia plan  Airway & Oxygen Therapy: Patient remains intubated per anesthesia plan  Post-op Assessment: Report given to RN and Post -op Vital signs reviewed and stable  Post vital signs: Reviewed and stable  Last Vitals:  Vitals:   07/24/17 0703 07/24/17 1500  BP: (!) 153/93   Pulse: 82 90  Resp: 20 18  Temp: 36.7 C     Last Pain:  Vitals:   07/24/17 0703  TempSrc: Oral         Complications: No apparent anesthesia complications

## 2017-07-24 NOTE — Anesthesia Procedure Notes (Signed)
Central Venous Catheter Insertion Performed by: Annye Asa, anesthesiologist Start/End8/12/2016 7:58 AM, 07/24/2017 8:09 AM Preanesthetic checklist: patient identified, IV checked, site marked, risks and benefits discussed, surgical consent, monitors and equipment checked, pre-op evaluation, timeout performed and anesthesia consent Position: supine Lidocaine 1% used for infiltration and patient sedated Hand hygiene performed , maximum sterile barriers used  and Seldinger technique used Catheter size: 8.5 Fr PA cath was placed.Sheath introducer Swan type:thermodilution Procedure performed using ultrasound guided technique. Ultrasound Notes:anatomy identified, needle tip was noted to be adjacent to the nerve/plexus identified, no ultrasound evidence of intravascular and/or intraneural injection and image(s) printed for medical record Attempts: 1 Following insertion, line sutured and dressing applied. Post procedure assessment: blood return through all ports, free fluid flow and no air  Patient tolerated the procedure well with no immediate complications. Additional procedure comments: PA catheter:  Routine monitors. Timeout, sterile prep, drape, FBP L neck.  Supine position.  1% Lido local, finder and trocar LIJ 1st pass with US guidance.  Cordis placed over J wire. PA catheter in easily.  Sterile dressing applied.  Patient tolerated well, VSS.  Jenita Seashore, MD.

## 2017-07-25 ENCOUNTER — Encounter (HOSPITAL_COMMUNITY): Payer: Self-pay | Admitting: Thoracic Surgery (Cardiothoracic Vascular Surgery)

## 2017-07-25 ENCOUNTER — Inpatient Hospital Stay (HOSPITAL_COMMUNITY): Payer: PPO

## 2017-07-25 LAB — CBC
HCT: 33.6 % — ABNORMAL LOW (ref 39.0–52.0)
HEMATOCRIT: 32.2 % — AB (ref 39.0–52.0)
Hemoglobin: 11.3 g/dL — ABNORMAL LOW (ref 13.0–17.0)
Hemoglobin: 11.8 g/dL — ABNORMAL LOW (ref 13.0–17.0)
MCH: 30.9 pg (ref 26.0–34.0)
MCH: 31.7 pg (ref 26.0–34.0)
MCHC: 35.1 g/dL (ref 30.0–36.0)
MCHC: 35.1 g/dL (ref 30.0–36.0)
MCV: 88 fL (ref 78.0–100.0)
MCV: 90.3 fL (ref 78.0–100.0)
PLATELETS: 112 10*3/uL — AB (ref 150–400)
Platelets: 122 10*3/uL — ABNORMAL LOW (ref 150–400)
RBC: 3.66 MIL/uL — AB (ref 4.22–5.81)
RBC: 3.72 MIL/uL — AB (ref 4.22–5.81)
RDW: 12.4 % (ref 11.5–15.5)
RDW: 13.1 % (ref 11.5–15.5)
WBC: 13.6 10*3/uL — AB (ref 4.0–10.5)
WBC: 15.2 10*3/uL — AB (ref 4.0–10.5)

## 2017-07-25 LAB — BASIC METABOLIC PANEL
ANION GAP: 7 (ref 5–15)
BUN: 14 mg/dL (ref 6–20)
CALCIUM: 7 mg/dL — AB (ref 8.9–10.3)
CO2: 18 mmol/L — ABNORMAL LOW (ref 22–32)
CREATININE: 1.2 mg/dL (ref 0.61–1.24)
Chloride: 110 mmol/L (ref 101–111)
Glucose, Bld: 161 mg/dL — ABNORMAL HIGH (ref 65–99)
Potassium: 4.3 mmol/L (ref 3.5–5.1)
Sodium: 135 mmol/L (ref 135–145)

## 2017-07-25 LAB — GLUCOSE, CAPILLARY
GLUCOSE-CAPILLARY: 146 mg/dL — AB (ref 65–99)
GLUCOSE-CAPILLARY: 145 mg/dL — AB (ref 65–99)
GLUCOSE-CAPILLARY: 117 mg/dL — AB (ref 65–99)
GLUCOSE-CAPILLARY: 137 mg/dL — AB (ref 65–99)
Glucose-Capillary: 122 mg/dL — ABNORMAL HIGH (ref 65–99)
Glucose-Capillary: 134 mg/dL — ABNORMAL HIGH (ref 65–99)

## 2017-07-25 LAB — MAGNESIUM
MAGNESIUM: 2.3 mg/dL (ref 1.7–2.4)
Magnesium: 2.4 mg/dL (ref 1.7–2.4)

## 2017-07-25 LAB — CREATININE, SERUM: Creatinine, Ser: 1.08 mg/dL (ref 0.61–1.24)

## 2017-07-25 MED ORDER — FUROSEMIDE 10 MG/ML IJ SOLN
20.0000 mg | Freq: Four times a day (QID) | INTRAMUSCULAR | Status: AC
  Administered 2017-07-25 (×3): 20 mg via INTRAVENOUS
  Filled 2017-07-25 (×3): qty 2

## 2017-07-25 MED ORDER — WARFARIN - PHYSICIAN DOSING INPATIENT: Freq: Every day | Status: DC

## 2017-07-25 MED ORDER — INSULIN ASPART 100 UNIT/ML ~~LOC~~ SOLN
0.0000 [IU] | SUBCUTANEOUS | Status: DC
  Administered 2017-07-25 – 2017-07-26 (×4): 2 [IU] via SUBCUTANEOUS

## 2017-07-25 MED ORDER — METOPROLOL TARTRATE 25 MG PO TABS
25.0000 mg | ORAL_TABLET | Freq: Two times a day (BID) | ORAL | Status: DC
  Administered 2017-07-25 – 2017-07-28 (×6): 25 mg via ORAL
  Filled 2017-07-25 (×6): qty 1

## 2017-07-25 MED ORDER — WARFARIN SODIUM 2.5 MG PO TABS
2.5000 mg | ORAL_TABLET | Freq: Every day | ORAL | Status: DC
  Administered 2017-07-25 – 2017-07-26 (×2): 2.5 mg via ORAL
  Filled 2017-07-25 (×2): qty 1

## 2017-07-25 MED ORDER — ENOXAPARIN SODIUM 40 MG/0.4ML ~~LOC~~ SOLN
40.0000 mg | Freq: Every day | SUBCUTANEOUS | Status: DC
  Administered 2017-07-26 – 2017-07-27 (×2): 40 mg via SUBCUTANEOUS
  Filled 2017-07-25 (×2): qty 0.4

## 2017-07-25 MED FILL — Magnesium Sulfate Inj 50%: INTRAMUSCULAR | Qty: 10 | Status: CN

## 2017-07-25 MED FILL — Cefuroxime Sodium For Inj 750 MG: INTRAMUSCULAR | Qty: 750 | Status: AC

## 2017-07-25 MED FILL — Heparin Sodium (Porcine) Inj 1000 Unit/ML: INTRAMUSCULAR | Qty: 30 | Status: CN

## 2017-07-25 MED FILL — Potassium Chloride Inj 2 mEq/ML: INTRAVENOUS | Qty: 20 | Status: CN

## 2017-07-25 MED FILL — Heparin Sodium (Porcine) Inj 1000 Unit/ML: INTRAMUSCULAR | Qty: 2500 | Status: AC

## 2017-07-25 NOTE — Progress Notes (Addendum)
[facility] BRIEF SICU PROGRESS NOTE  1 Day Post-Op  S/P Procedure(s) (LRB): MINIMALLY INVASIVE MITRAL VALVE REPAIR (MVR) (Right) TRANSESOPHAGEAL ECHOCARDIOGRAM (TEE) (N/A)   Stable day NSR w/ stable BP although somewhat hypertensive Breathing comfortably w/ O2 sats 99% on 1 L/min Diuresing well Labs okay  Plan: Continue routine care.  Will increase metoprolol and restart ARB tomorrow if renal fxn stable  Purcell Nailslarence H Gill Delrossi, MD 07/25/2017 8:31 PM

## 2017-07-25 NOTE — Anesthesia Postprocedure Evaluation (Signed)
Anesthesia Post Note  Patient: Craig Griffin  Procedure(s) Performed: Procedure(s) (LRB): MINIMALLY INVASIVE MITRAL VALVE REPAIR (MVR) (Right) TRANSESOPHAGEAL ECHOCARDIOGRAM (TEE) (N/A)     Patient location during evaluation: SICU Anesthesia Type: General Level of consciousness: awake and alert, patient cooperative and oriented Pain management: pain level controlled Vital Signs Assessment: post-procedure vital signs reviewed and stable Respiratory status: spontaneous breathing, nonlabored ventilation, respiratory function stable and patient connected to nasal cannula oxygen Cardiovascular status: blood pressure returned to baseline and stable : nausea improving, appetite diminished. Anesthetic complications: no Comments: Mild sore throat, pt states feels better today    Last Vitals:  Vitals:   07/25/17 1130 07/25/17 1534  BP:    Pulse: 81   Resp: 15   Temp:  36.7 C    Last Pain:  Vitals:   07/25/17 1534  TempSrc: Oral  PainSc:                  [name],[name]

## 2017-07-25 NOTE — Procedures (Signed)
Extubation Procedure Note  Pt extubated following Cardiac Rapid Wean. Pt follows all commands.  NIF -40 VC 1.1L Cuff Leak + No Stridor post extubation Pt extubated to 4L [place].   Patient Details:   Name: Craig Griffin DOB: 1951-12-18 MRN: [id number]   Airway Documentation:     Evaluation  O2 sats: stable throughout Complications: No apparent complications Patient did tolerate procedure well. Bilateral Breath Sounds: Clear, Diminished   Yes  Elmer PickerClifton, Quanita Barona D 07/25/2017, 12:33 AM

## 2017-07-25 NOTE — Progress Notes (Signed)
[address]       [place] [id number]             [phone]        CARDIOTHORACIC SURGERY PROGRESS NOTE   R1 Day Post-Op Procedure(s) (LRB): MINIMALLY INVASIVE MITRAL VALVE REPAIR (MVR) (Right) TRANSESOPHAGEAL ECHOCARDIOGRAM (TEE) (N/A)  Subjective: Looks good and reports feeling well.  Minimal soreness in chest.  No SOB.  No nausea  Objective: Vital signs: BP Readings from Last 1 Encounters:  07/25/17 94/65   Pulse Readings from Last 1 Encounters:  07/25/17 84   Resp Readings from Last 1 Encounters:  07/25/17 17   Temp Readings from Last 1 Encounters:  07/25/17 98.6 F (37 C)    Hemodynamics: PAP: (26-40)/(13-21) 35/18 CO:  [2.8 L/min-7.5 L/min] 7.5 L/min CI:  [2.4 L/min/m2-6.1 L/min/m2] 3.5 L/min/m2  Physical Exam:  Rhythm:   sinus  Breath sounds: clear  Heart sounds:  RRR w/out murmur  Incisions:  Dressings dry, intact  Abdomen:  Soft, non-distended, non-tender  Extremities:  Warm, well-perfused  Chest tubes:  low volume thin serosanguinous output, no air leak    Intake/Output from previous day: 08/01 0701 - 08/02 0700 In: 5848.6 [I.V.:4538.6; Blood:660; IV Piggyback:650] Out: [id number] [Urine:2225; Blood:1300; Chest Tube:340] Intake/Output this shift: No intake/output data recorded.  Lab Results:  CBC: Recent Labs  07/24/17 2138 07/25/17 0347  WBC 16.4* 13.6*  HGB 12.4* 11.3*  HCT 35.1* 32.2*  PLT 150 122*    BMET:  Recent Labs  07/22/17 1546  07/24/17 2108 07/24/17 2138 07/25/17 0347  NA 138  < > 139  --  135  K 4.1  < > 4.3  --  4.3  CL 106  < > 109  --  110  CO2 25  --   --   --  18*  GLUCOSE 89  < > 160*  --  161*  BUN 19  < > 15  --  14  CREATININE 1.19  < > 1.00 1.16 1.20  CALCIUM 9.2  --   --   --  7.0*  < > = values in this interval not displayed.   PT/INR:   Recent Labs  07/24/17 1522  LABPROT 16.9*  INR 1.36    CBG (last 3)   Recent Labs  07/24/17 1922 07/24/17 2338 07/25/17 0723    GLUCAP 140* 149* 145*    ABG    Component Value Date/Time   PHART 7.331 (L) 07/24/2017 2157   PCO2ART 37.0 07/24/2017 2157   PO2ART 78.0 (L) 07/24/2017 2157   HCO3 19.6 (L) 07/24/2017 2157   TCO2 21 07/24/2017 2157   ACIDBASEDEF 6.0 (H) 07/24/2017 2157   O2SAT 95.0 07/24/2017 2157    CXR: PORTABLE CHEST 1 VIEW  COMPARISON:  A1 18 and earlier.  FINDINGS: Portable AP upright view at 0620 hours. Left IJ approach Swan-Ganz catheter tip is in stable position at the main pulmonary artery level. Stable right chest tubes. Epicardial pacer wires remain. Enteric tube has been removed. Extubated.  Lower lung volumes. No pneumothorax identified. Streaky opacity at the lung bases greater on the right. Pulmonary vascularity remains within normal limits. No pleural effusion or consolidation identified. Stable cardiomegaly and mediastinal contours.  Mildly increased gaseous distension of the stomach.  IMPRESSION: 1. Extubated and enteric tube removed. Otherwise stable lines and tubes. 2. Lower lung volumes with bibasilar opacity suspected to be atelectasis. 3. No pneumothorax or pulmonary edema.   Electronically Signed  By: Odessa FlemingH  Hall M.D.   On: 07/25/2017 07:29   EKG: NSR w/out acute ischemic changes    Assessment/Plan: S/P Procedure(s) (LRB): MINIMALLY INVASIVE MITRAL VALVE REPAIR (MVR) (Right) TRANSESOPHAGEAL ECHOCARDIOGRAM (TEE) (N/A)  Doing well POD1 Maintaining NSR w/ stable hemodynamics on low dose milrinone Breathing comfortably w/ O2 sats 97-98% on 4 L/min Expected post op acute blood loss anemia, mild Expected post op atelectasis, mild Chronic diastolic CHF with expected post-op volume excess, weight reportedly 12 lbs > preop Post op thrombocytopenia, mild   Mobilize  Diuresis  Wean milrinone off  D/C lines  Leave tubes in for now  Start coumadin   Purcell Nailslarence H Javoris Star, MD 07/25/2017 7:54 AM

## 2017-07-25 NOTE — Care Management Note (Signed)
Case Management Note Donn PieriniKristi Deloyce Walthers RN, BSN Unit 4E-Case Manager [phone]  Patient Details  Name: Craig Griffin MRN: [id number] Date of Birth: 08-30-1951  Subjective/Objective:    Pt admitted s/p mini MVR on 07/24/17                Action/Plan: PTA pt lived at home with spouse- anticipate return home- CM to follow  Expected Discharge Date:                  Expected Discharge Plan:  Home/Self Care  In-House Referral:     Discharge planning Services  CM Consult  Post Acute Care Choice:    Choice offered to:     DME Arranged:    DME Agency:     HH Arranged:    HH Agency:     Status of Service:  In process, will continue to follow  If discussed at Long Length of Stay Meetings, dates discussed:    Discharge Disposition:   Additional Comments:  [name], Levette Paulick Hall, RN 07/25/2017, 12:00 PM

## 2017-07-26 ENCOUNTER — Inpatient Hospital Stay (HOSPITAL_COMMUNITY): Payer: PPO

## 2017-07-26 LAB — BASIC METABOLIC PANEL
ANION GAP: 5 (ref 5–15)
BUN: 13 mg/dL (ref 6–20)
CALCIUM: 7.1 mg/dL — AB (ref 8.9–10.3)
CO2: 27 mmol/L (ref 22–32)
CREATININE: 1.14 mg/dL (ref 0.61–1.24)
Chloride: 103 mmol/L (ref 101–111)
GFR calc Af Amer: >60 mL/min (ref >60)
GFR calc non Af Amer: >60 mL/min (ref >60)
GLUCOSE: 103 mg/dL — AB (ref 65–99)
Potassium: 3.7 mmol/L (ref 3.5–5.1)
Sodium: 135 mmol/L (ref 135–145)

## 2017-07-26 LAB — GLUCOSE, CAPILLARY: Glucose-Capillary: 94 mg/dL (ref 65–99)

## 2017-07-26 LAB — CBC
HCT: 32.6 % — ABNORMAL LOW (ref 39.0–52.0)
HEMOGLOBIN: 11.1 g/dL — AB (ref 13.0–17.0)
MCH: 30.7 pg (ref 26.0–34.0)
MCHC: 34 g/dL (ref 30.0–36.0)
MCV: 90.3 fL (ref 78.0–100.0)
PLATELETS: 104 10*3/uL — AB (ref 150–400)
RBC: 3.61 MIL/uL — ABNORMAL LOW (ref 4.22–5.81)
RDW: 12.9 % (ref 11.5–15.5)
WBC: 13.6 10*3/uL — ABNORMAL HIGH (ref 4.0–10.5)

## 2017-07-26 LAB — PROTIME-INR
INR: 1.22
PROTHROMBIN TIME: 15.5 s — AB (ref 11.4–15.2)

## 2017-07-26 MED ORDER — POTASSIUM CHLORIDE CRYS ER 20 MEQ PO TBCR
20.0000 meq | EXTENDED_RELEASE_TABLET | Freq: Two times a day (BID) | ORAL | Status: DC
  Administered 2017-07-27 – 2017-07-28 (×3): 20 meq via ORAL
  Filled 2017-07-26 (×3): qty 1

## 2017-07-26 MED ORDER — SODIUM CHLORIDE 0.9 % IV SOLN: 250.0000 mL | INTRAVENOUS | Status: DC | PRN

## 2017-07-26 MED ORDER — POTASSIUM CHLORIDE CRYS ER 20 MEQ PO TBCR: 20.0000 meq | EXTENDED_RELEASE_TABLET | ORAL | Status: DC | PRN

## 2017-07-26 MED ORDER — SODIUM CHLORIDE 0.9% FLUSH: 3.0000 mL | INTRAVENOUS | Status: DC | PRN

## 2017-07-26 MED ORDER — ASPIRIN EC 81 MG PO TBEC
81.0000 mg | DELAYED_RELEASE_TABLET | Freq: Every day | ORAL | Status: DC
  Administered 2017-07-26 – 2017-07-28 (×3): 81 mg via ORAL
  Filled 2017-07-26 (×3): qty 1

## 2017-07-26 MED ORDER — MOVING RIGHT ALONG BOOK
Freq: Once | Status: DC
  Filled 2017-07-26: qty 1

## 2017-07-26 MED ORDER — FUROSEMIDE 40 MG PO TABS
40.0000 mg | ORAL_TABLET | Freq: Two times a day (BID) | ORAL | Status: DC
Start: 2017-07-26 — End: 2017-07-28
  Administered 2017-07-26 – 2017-07-28 (×5): 40 mg via ORAL
  Filled 2017-07-26 (×5): qty 1

## 2017-07-26 MED ORDER — POTASSIUM CHLORIDE 10 MEQ/50ML IV SOLN
10.0000 meq | INTRAVENOUS | Status: AC
  Administered 2017-07-26 (×2): 10 meq via INTRAVENOUS
  Filled 2017-07-26 (×3): qty 50

## 2017-07-26 MED ORDER — SODIUM CHLORIDE 0.9% FLUSH
3.0000 mL | Freq: Two times a day (BID) | INTRAVENOUS | Status: DC
  Administered 2017-07-26 – 2017-07-27 (×2): 3 mL via INTRAVENOUS

## 2017-07-26 NOTE — Progress Notes (Addendum)
[address]       [place], [id number]             [phone]        CARDIOTHORACIC SURGERY PROGRESS NOTE   R2 Days Post-Op Procedure(s) (LRB): MINIMALLY INVASIVE MITRAL VALVE REPAIR (MVR) (Right) TRANSESOPHAGEAL ECHOCARDIOGRAM (TEE) (N/A)  Subjective: Looks good and feels well.  Minimal pain.  Objective: Vital signs: BP Readings from Last 1 Encounters:  07/26/17 119/85   Pulse Readings from Last 1 Encounters:  07/26/17 79   Resp Readings from Last 1 Encounters:  07/26/17 (!) 21   Temp Readings from Last 1 Encounters:  07/26/17 98.5 F (36.9 C) (Oral)    Hemodynamics: PAP: (29-40)/(19-26) 38/26  Physical Exam:  Rhythm:   sinus  Breath sounds: clear  Heart sounds:  RRR w/out murmur  Incisions:  Dressings dry, intact  Abdomen:  Soft, non-distended, non-tender  Extremities:  Warm, well-perfused  Chest tubes:  decreasing volume thin serosanguinous output, no air leak    Intake/Output from previous day: 08/02 0701 - 08/03 0700 In: 727.9 [P.O.:300; I.V.:377.9; IV Piggyback:50] Out: 3960 [Urine:3680; Chest Tube:280] Intake/Output this shift: No intake/output data recorded.  Lab Results:  CBC: Recent Labs  07/25/17 1840 07/26/17 0359  WBC 15.2* 13.6*  HGB 11.8* 11.1*  HCT 33.6* 32.6*  PLT 112* 104*    BMET:  Recent Labs  07/25/17 0347 07/25/17 1744 07/26/17 0359  NA 135  --  135  K 4.3  --  3.7  CL 110  --  103  CO2 18*  --  27  GLUCOSE 161*  --  103*  BUN 14  --  13  CREATININE 1.20 1.08 1.14  CALCIUM 7.0*  --  7.1*     PT/INR:   Recent Labs  07/26/17 0359  LABPROT 15.5*  INR 1.22    CBG (last 3)   Recent Labs  07/25/17 1925 07/25/17 2330 07/26/17 0346  GLUCAP 137* 134* 94    ABG    Component Value Date/Time   PHART 7.331 (L) 07/24/2017 2157   PCO2ART 37.0 07/24/2017 2157   PO2ART 78.0 (L) 07/24/2017 2157   HCO3 19.6 (L) 07/24/2017 2157   TCO2 21 07/24/2017 2157   ACIDBASEDEF 6.0 (H) 07/24/2017  2157   O2SAT 95.0 07/24/2017 2157    CXR: PORTABLE CHEST 1 VIEW  COMPARISON:  July 25, 2017  FINDINGS: Swan-Ganz catheter has been removed. Cordis tip is in the left innominate vein. No pneumothorax. Temporary pacemaker leads are attached to the right heart.  There are small pleural effusions bilaterally with atelectatic change scattered throughout the right lung. There is slight left base atelectasis. No consolidation. Heart is upper normal in size with pulmonary vascularity within normal limits. No adenopathy. Patient is status post mitral valve replacement. No evident bone lesions.  IMPRESSION: Cordis tip in left innominate vein. No pneumothorax. Patchy atelectasis throughout the right lung. Mild left base atelectasis. No consolidation. Small pleural effusions bilaterally. Stable cardiac prominence.   Electronically Signed   By: Bretta BangWilliam  Woodruff III M.D.   On: 07/26/2017 07:35     Assessment/Plan: S/P Procedure(s) (LRB): MINIMALLY INVASIVE MITRAL VALVE REPAIR (MVR) (Right) TRANSESOPHAGEAL ECHOCARDIOGRAM (TEE) (N/A)  Doing well POD2 Maintaining NSR w/ stable BP Breathing comfortably w/ O2 sats 95% on 1 L/min Expected post op acute blood loss anemia, mild, stable Post op thrombocytopenia, mild, stable Chronic diastolic CHF with expected post-op volume excess, diuresing well and weight down but still 7 lbs >  preop Hypertension, well controlled   Mobilize  Diuresis  Continue metoprolol  Restart ARB at reduced dose prior to d/c if BP will allow  Coumadin  Continue ASA until therapeutic on Coumadin  D/C pacing wires  D/C chest tubes 1-2 days once output decreases further  Transfer step down  Possible D/C home 2-3 days  Purcell Nailslarence H Owen, MD 07/26/2017 8:31 AM   Discussed plans for follow up w/ patient who agrees w/ plan to see Dr. Tomie Chinaevankar in his [facility].  Will need INR checked some time next week depending on date  of d/c  Purcell Nailslarence H Owen, MD 07/26/2017 5:14 PM

## 2017-07-26 NOTE — Progress Notes (Signed)
Pt received from 2H. Pt, wife, and daughters oriented to room and equipment. Call bell within reach. Lunch at bedside. Pt denies needs at this time.   Leonidas Rombergaitlin S Bumbledare, RN

## 2017-07-26 NOTE — Discharge Summary (Signed)
Physician Discharge Summary  Patient ID: Craig Griffin MRN: [id number] DOB/AGE: [birth year] 66 y.o.  Admit date: 07/24/2017 Discharge date: 07/28/2017  Admission Diagnoses:  Patient Active Problem List   Diagnosis Date Noted  . Essential hypertension   . Chronic diastolic congestive heart failure (HCC)   . Mitral valve prolapse   . Mitral regurgitation due to cusp prolapse    Discharge Diagnoses:   Patient Active Problem List   Diagnosis Date Noted  . S/P minimally invasive mitral valve repair 07/24/2017  . Essential hypertension   . Chronic diastolic congestive heart failure (HCC)   . Mitral valve prolapse   . Mitral regurgitation due to cusp prolapse    Discharged Condition: good  History of Present Illness:  Mr. Craig Griffin is a 66 year old male with history of hypertension and recently discovered mitral valve prolapse with severe symptomatic primary mitral regurgitation who has been referred for second surgical opinion. The patient states that he has been treated for hypertension for many years. He has remained physically active and without significant limitations until fairly recently. Approximately one year ago he began to experience symptoms of exertional shortness of breath and fatigue. He was noted to have a heart murmur on routine physical exam by his primary care physician and transthoracic echocardiogram revealed mitral valve prolapse with severe mitral regurgitation and normal left ventricular systolic function. The patient was evaluated by Dr. Tomie Chinaevankar and referred to [facility] Sil[facility] for [facility]. The patient was scheduled for elective outpatient cardiac catheterization and surgery, but the patient was subsequently informed that [facility] did not accept the patient's healthcare insurance. The patient was subsequently referred for elective second opinion consultation.  He was evaluated by Dr. Cornelius Moraswen who felt patient  would benefit from Mitral Valve Repair. He would require catheterization prior to proceeding with surgery.  He explained the risks and benefits of the procedure and he was agreeable to proceed.  Hospital Course:   Mr. Craig Griffin [facility] 07[facility]2017.  He was taken to the operating room and underwent Minimally invasive Mitral Valve Repair (procedure posted below).  He tolerated the procedure without difficulty and was taken to the SICU in stable condition.  The patient was extubated the evening of surgery.  During his stay in the SICU the patient was weaned off Milrinone as tolerated.  He was started on coumadin for stroke prophylaxis.  He was hypertensive and started on home ARB for better BP control.  He was maintaining NSR and his pacing wires were removed on POD #2.  He was diuresed for hypervolemia.  He was felt medically stable for transfer to the telemetry unit on 07/26/2017.  He continued to make progress.  His chest tubes were removed on POD #4.  He was hypertensive and was restarted on his home dose of Benicar.  He was maintaining NSR.  He is on coumadin at 5 mg daily.  His most recent INR remained subtherapeutic at 1.14.  He will require PT/INR follow up on Tuesday with Dr. Henrietta Hooveravenkar.  He is ambulating independently.  He is tolerating a diet.  He is felt medically stable for discharge home today.      Treatments: surgery:    Minimally-Invasive Mitral Valve Repair             Complex valvuloplasty including triangular resection of flail segment (P2) of posterior leaflet             Artificial Gore-tex neochord placement  x4             Sorin Memo 3D Ring Annuloplasty (size 32mm, catalog # [id number], serial # [id number])  Disposition: 01-Home or Self Care   Discharge Medications:  The patient has been discharged on:   1.Beta Blocker:  Yes [ x  ]                              No   [   ]                              If No, reason:  2.Ace Inhibitor/ARB: Yes [ x  ]                                      No  [    ]                                     If No, reason:  3.Statin:   Yes [   ]                  No  [ x  ]                  If No, reason: NO CAD  4.Marlowe KaysEcasaValentino Hue:  Yes  [ Y  ]                  No   [   ]                  If No, reason:     Discharge Instructions    Remove sutures    Complete by:  As directed    Please remove suture at follow up Cardiology visit, if uncomfortable will remove at follow up on 8/20     Allergies as of 07/28/2017   No Known Allergies     Medication List    TAKE these medications   acetaminophen 500 MG tablet Commonly known as:  TYLENOL Take 2 tablets (1,000 mg total) by mouth every 6 (six) hours as needed.   amoxicillin 500 MG capsule Commonly known as:  AMOXIL Take 2,000 mg by mouth. 4 capsules...1 hr before any dental procedures   aspirin 81 MG EC tablet Take 1 tablet (81 mg total) by mouth daily.   furosemide 40 MG tablet Commonly known as:  LASIX Take 1 tablet (40 mg total) by mouth 2 (two) times daily. Then on Tuesday 8/7 reduce to 1 tablet daily for 7 days   metoprolol tartrate 25 MG tablet Commonly known as:  LOPRESSOR Take 1 tablet (25 mg total) by mouth 2 (two) times daily.   olmesartan 40 MG tablet Commonly known as:  BENICAR Take 40 mg by mouth daily.   potassium chloride SA 20 MEQ tablet Commonly known as:  K-DUR,KLOR-CON Take 1 tablet (20 mEq total) by mouth 2 (two) times daily. Until Tuesday 8/7, take 1 tablet daily for 7 days   traMADol 50 MG tablet Commonly known as:  ULTRAM Take 1-2 tablets (50-100 mg total) by mouth every 4 (four) hours as needed for moderate pain.   warfarin 5 MG tablet Commonly known as:  COUMADIN Take 1 tablet (  5 mg total) by mouth daily at 6 PM.      Follow-up Information    Revankar, Aundra Dubin, MD Follow up on 07/30/2017.   Specialty:  Cardiology Why:  Appointment is at your convience for PT/INR draw  Also have an appointment on 08/08/2017 at 2:20 for hospital  follow up Contact information: [address] [place]  [place] [address] [phone]        [facility] Follow up on 08/12/2017.   Specialty:  Cardiothoracic Surgery Why:  Appointment is at 1:45, please get CXR at 1:15 at [facility] located on first floor of our office building Contact information: [address] [place], [address] [place] [address] [phone]          Signed: Lowella Dandy 07/28/2017, 1:47 PM

## 2017-07-26 NOTE — Progress Notes (Signed)
CARDIAC REHAB PHASE I   PRE:  Rate/Rhythm: 76 SR  BP:  Supine:   Sitting: 136/96  Standing:    SaO2: 96%RA  MODE:  Ambulation: 470 ft   POST:  Rate/Rhythm: 93 SR  BP:  Supine:   Sitting: 139/89  Standing:    SaO2: 95%RA 1405-1443 Pt walked 470 ft on RA with rolling walker and minimal asst. A little sleepy from pain med. Denied dizziness. Had walked 740 ft this morning on 2H. To recliner with call bell. Wife in room. Chest tube intact.   Luetta Nuttingharlene Wilbern Pennypacker, RN BSN  07/26/2017 2:40 PM

## 2017-07-27 LAB — CBC
HCT: 34 % — ABNORMAL LOW (ref 39.0–52.0)
HEMOGLOBIN: 11.5 g/dL — AB (ref 13.0–17.0)
MCH: 30.7 pg (ref 26.0–34.0)
MCHC: 33.8 g/dL (ref 30.0–36.0)
MCV: 90.9 fL (ref 78.0–100.0)
Platelets: 133 10*3/uL — ABNORMAL LOW (ref 150–400)
RBC: 3.74 MIL/uL — ABNORMAL LOW (ref 4.22–5.81)
RDW: 12.9 % (ref 11.5–15.5)
WBC: 13.5 10*3/uL — ABNORMAL HIGH (ref 4.0–10.5)

## 2017-07-27 LAB — PROTIME-INR
INR: 1.13
PROTHROMBIN TIME: 14.6 s (ref 11.4–15.2)

## 2017-07-27 LAB — BASIC METABOLIC PANEL
ANION GAP: 7 (ref 5–15)
BUN: 12 mg/dL (ref 6–20)
CHLORIDE: 101 mmol/L (ref 101–111)
CO2: 29 mmol/L (ref 22–32)
Calcium: 7.9 mg/dL — ABNORMAL LOW (ref 8.9–10.3)
Creatinine, Ser: 1.09 mg/dL (ref 0.61–1.24)
GFR calc non Af Amer: >60 mL/min (ref >60)
Glucose, Bld: 121 mg/dL — ABNORMAL HIGH (ref 65–99)
POTASSIUM: 4.1 mmol/L (ref 3.5–5.1)
SODIUM: 137 mmol/L (ref 135–145)

## 2017-07-27 MED ORDER — WARFARIN SODIUM 5 MG PO TABS
5.0000 mg | ORAL_TABLET | Freq: Every day | ORAL | Status: DC
  Administered 2017-07-27: 5 mg via ORAL
  Filled 2017-07-27 (×2): qty 1

## 2017-07-27 MED ORDER — IRBESARTAN 300 MG PO TABS
300.0000 mg | ORAL_TABLET | Freq: Every day | ORAL | Status: DC
  Administered 2017-07-27 – 2017-07-28 (×2): 300 mg via ORAL
  Filled 2017-07-27 (×2): qty 1

## 2017-07-27 NOTE — Progress Notes (Addendum)
      [address]       [place] [id number]             [phone]      3 Days Post-Op Procedure(s) (LRB): MINIMALLY INVASIVE MITRAL VALVE REPAIR (MVR) (Right) TRANSESOPHAGEAL ECHOCARDIOGRAM (TEE) (N/A)   Subjective:  Mr. Craig Griffin looks great.  He has no complaints and had just finished ambulating in the hallway.  + BM  Objective: Vital signs in last 24 hours: Temp:  [97.8 F (36.6 C)-99.5 F (37.5 C)] 98.4 F (36.9 C) (08/04 0801) Pulse Rate:  [74-84] 84 (08/04 0801) Cardiac Rhythm: Normal sinus rhythm (08/03 1900) Resp:  [12-19] 16 (08/04 0801) BP: (124-150)/(76-96) 150/76 (08/04 0801) SpO2:  [93 %-98 %] 97 % (08/04 0801)  Intake/Output from previous day: 08/03 0701 - 08/04 0700 In: 580 [P.O.:480; IV Piggyback:100] Out: 3605 [Urine:3575; Chest Tube:30]  General appearance: alert, cooperative and no distress Heart: regular rate and rhythm Lungs: clear to auscultation bilaterally Abdomen: soft, non-tender; bowel sounds normal; no masses,  no organomegaly Extremities: edema trace Wound: clean and dry  Lab Results:  Recent Labs  07/26/17 0359 07/27/17 0215  WBC 13.6* 13.5*  HGB 11.1* 11.5*  HCT 32.6* 34.0*  PLT 104* 133*   BMET:  Recent Labs  07/26/17 0359 07/27/17 0215  NA 135 137  K 3.7 4.1  CL 103 101  CO2 27 29  GLUCOSE 103* 121*  BUN 13 12  CREATININE 1.14 1.09  CALCIUM 7.1* 7.9*    PT/INR:  Recent Labs  07/27/17 0215  LABPROT 14.6  INR 1.13   ABG    Component Value Date/Time   PHART 7.331 (L) 07/24/2017 2157   HCO3 19.6 (L) 07/24/2017 2157   TCO2 21 07/24/2017 2157   ACIDBASEDEF 6.0 (H) 07/24/2017 2157   O2SAT 95.0 07/24/2017 2157   CBG (last 3)   Recent Labs  07/25/17 1925 07/25/17 2330 07/26/17 0346  GLUCAP 137* 134* 94    Assessment/Plan: S/P Procedure(s) (LRB): MINIMALLY INVASIVE MITRAL VALVE REPAIR (MVR) (Right) TRANSESOPHAGEAL ECHOCARDIOGRAM (TEE) (N/A)  1. CV- NSR, + HTN SBP in the 150s- will  continue Lopressor at 25 mg BID, restart home Benicar 2. Pulm- no acute issues, CT with 30 cc output yesterday, will d/c today.. Get CXR in AM 3. Renal- creatinine has been WNl, weight is trending down, continue Lasix 4. INR 1.13- decreased after 2 doses of 2.5 mg, will give 5 mg daily tonight 5. Dispo- patient looks great, d/c chest tubes today, increase coumadin, EPW are out.. Plan to d/c in AM if remains stable   LOS: 3 days    BARRETT, ERIN 07/27/2017 Patient seen and examined, agree with above Diuresing well Dc chest tubes  Viviann SpareSteven C. Dorris FetchHendrickson, MD [facility] [phone]

## 2017-07-27 NOTE — Discharge Instructions (Signed)
Mitral Valve Repair, Care After This sheet gives you information about how to care for yourself after your procedure. Your health care provider may also give you more specific instructions. If you have problems or questions, contact your health care provider. What can I expect after the procedure? After the procedure, it is common to have:  Pain at the incision area that may last for several weeks.  Follow these instructions at home: Incision care  Follow instructions from your health care provider about how to take care of your incision. Make sure you: ? Wash your hands with soap and water before you change your bandage (dressing). If soap and water are not available, use hand sanitizer. ? Change your dressing as told by your health care provider. ? Leave stitches (sutures), skin glue, or adhesive strips in place. These skin closures may need to stay in place for 2 weeks or longer. If adhesive strip edges start to loosen and curl up, you may trim the loose edges. Do not remove adhesive strips completely unless your health care provider tells you to do that.  Check your incision area every day for signs of infection. Check for: ? More redness, swelling, or pain. ? More fluid or blood. ? Warmth. ? Pus or a bad smell.  Do not apply powder or lotion to the area. Driving  Do not drive until your health care provider approves.  Do not drive or use heavy machinery while taking prescription pain medicines. Bathing  Do not take baths, swim, or use a hot tub for 2-4 weeks after surgery, or until your health care provider approves. Ask your health care provider if you may take showers.  To wash the incision site, gently wash with soap and water and pat the area dry with a clean towel. Do not rub the incision area. That may cause bleeding. Activity  Rest as told by your health care provider. Ask your health care provider when you can resume normal activities, including sexual  activity.  Avoid the following activities for 6-8 weeks, or as long as directed: ? Lifting anything that is heavier than 10 lb (4.5 kg), or the limit that your health care provider tells you. ? Pushing or pulling things with your arms.  Avoid climbing stairs and using the handrail to pull yourself up for the first 2-3 weeks after surgery.  Avoid airplane travel for 4-6 weeks, or as long as directed.  Avoid sitting for long periods of time and crossing your legs. Get up and move around at least once every 1-2 hours.  If you are taking blood thinners (anticoagulants), avoid activities that have a high risk of injury. Ask your health care provider what activities are safe for you. Lifestyle  Limit alcohol intake to no more than 1 drink a day for nonpregnant women and 2 drinks a day for men. One drink equals 12 oz of beer, 5 oz of wine, or 1 oz of hard liquor.  Do not use any products that contain nicotine or tobacco, such as cigarettes and e-cigarettes. If you need help quitting, ask your health care provider. General instructions  Take your temperature every day and weigh yourself every morning for the first 7 days after surgery. Write your temperatures and weight down and take this record with you to any follow-up visits.  Take over-the-counter and prescription medicines only as told by your health care provider.  To prevent or treat constipation while you are taking prescription pain medicine, your health care provider  may recommend that you: ? Drink enough fluid to keep your urine clear or pale yellow. ? Take over-the-counter or prescription medicines. ? Eat foods that are high in fiber, such as fresh fruits and vegetables, whole grains, and beans. ? Limit foods that are high in fat and processed sugars, such as fried and sweet foods.  Follow instructions from your health care provider about eating or drinking restrictions.  Wear compression stockings for at least 2 weeks, or as  long as told by your health care provider. These stockings help to prevent blood clots and reduce swelling in your legs. If your ankles are swollen after 2 weeks, continue to wear the stockings.  Keep all follow-up visits as told by your health care provider. This is important. Contact a health care provider if:  You develop a skin rash.  Your weight is increasing each day over 2-3 days.  You gain 2 lb (1 kg) or more in a single day.  You have a fever. Get help right away if:  You develop chest pain that feels different from the pain caused by your incision.  You develop shortness of breath or difficulty breathing.  You have more redness, swelling, or pain around your incision.  You have more fluid or blood coming from your incision.  Your incision feels warm to the touch.  You have pus or a bad smell coming from your incision.  You feel light-headed. This information is not intended to replace advice given to you by your health care provider. Make sure you discuss any questions you have with your health care provider. Document Released: 06/29/2005 Document Revised: 09/21/2016 Document Reviewed: 09/21/2016 Elsevier Interactive Patient Education  2018 [employer]    Vitamin K Foods and Warfarin Warfarin is a blood thinner (anticoagulant). Anticoagulant medicines help prevent the formation of blood clots. These medicines work by decreasing the activity of vitamin K, which promotes normal blood clotting. When you take warfarin, problems can occur from suddenly increasing or decreasing the amount of vitamin K that you eat from one day to the next. Problems may include:  Blood clots.  Bleeding.  What general guidelines do I need to follow? To avoid problems when taking warfarin:  Eat a balanced diet that includes: ? Fresh fruits and vegetables. ? Whole grains. ? Low-fat dairy products. ? Lean proteins, such as fish, eggs, and lean cuts of meat.  Keep your intake of  vitamin K consistent from day to day. To do this: ? Avoid eating large amounts of vitamin K one day and low amounts of vitamin K the next day. ? If you take a multivitamin that contains vitamin K, be sure to take it every day. ? Know which foods contain vitamin K. Use the lists below to understand serving sizes and the amount of vitamin K in one serving.  Avoid major changes in your diet. If you are going to change your diet, talk with your health care provider before making changes.  Work with a Dealernutrition specialist (dietitian) to develop a meal plan that works best for you.  High vitamin K foods Foods that are high in vitamin K contain more than 100 mcg (micrograms) per serving. These include:  Broccoli (cooked) -  cup has 110 mcg.  Brussels sprouts (cooked) -  cup has 109 mcg.  Greens, beet (cooked) -  cup has 350 mcg.  Greens, collard (cooked) -  cup has 418 mcg.  Greens, turnip (cooked) -  cup has 265 mcg.  Ch[place] SiGreen  onions or scallions -  cup has 105 mcg.  Kale (fresh or frozen) -  cup has 531 mcg.  Parsley (raw) - 10 sprigs has 164 mcg.  Spinach (cooked) -  cup has 444 mcg.  Swiss chard (cooked) -  cup has 287 mcg.  Moderate vitamin K foods Foods that have a moderate amount of vitamin K contain 25-100 mcg per serving. These include:  Asparagus (cooked) - 5 spears have 38 mcg.  Black-eyed peas (dried) -  cup has 32 mcg.  Cabbage (cooked) -  cup has 37 mcg.  Kiwi fruit - 1 medium has 31 mcg.  Lettuce - 1 cup has 57-63 mcg.  Okra (frozen) -  cup has 44 mcg.  Prunes (dried) - 5 prunes have 25 mcg.  Watercress (raw) - 1 cup has 85 mcg.  Low vitamin K foods Foods low in vitamin K contain less than 25 mcg per serving. These include:  Artichoke - 1 medium has 18 mcg.  Avocado - 1 oz. has 6 mcg.  Blueberries -  cup has 14 mcg.  Cabbage (raw) -  cup has 21 mcg.  Carrots (cooked) -  cup has 11 mcg.  Cauliflower (raw) -  cup has 11  mcg.  Cucumber with peel (raw) -  cup has 9 mcg.  Grapes -  cup has 12 mcg.  Mango - 1 medium has 9 mcg.  Nuts - 1 oz. has 15 mcg.  Pear - 1 medium has 8 mcg.  Peas (cooked) -  cup has 19 mcg.  Pickles - 1 spear has 14 mcg.  Pumpkin seeds - 1 oz. has 13 mcg.  Sauerkraut (canned) -  cup has 16 mcg.  Soybeans (cooked) -  cup has 16 mcg.  Tomato (raw) - 1 medium has 10 mcg.  Tomato sauce -  cup has 17 mcg.  Vitamin K-free foods If a food contain less than 5 mcg per serving, it is considered to have no vitamin K. These foods include:  Bread and cereal products.  Cheese.  Eggs.  Fish and shellfish.  Meat and poultry.  Milk and dairy products.  Sunflower seeds.  Actual amounts of vitamin K in foods may be different depending on processing. Talk with your dietitian about what foods you can eat and what foods you should avoid. This information is not intended to replace advice given to you by your health care provider. Make sure you discuss any questions you have with your health care provider. Document Released: 10/07/2009 Document Revised: 07/01/2016 Document Reviewed: 03/14/2016 Elsevier Interactive Patient Education  2017 [employer].    Information on my medicine - Coumadin   (Warfarin)  This medication education was reviewed with me or my healthcare representative as part of my discharge preparation.   Why was Coumadin prescribed for you? Coumadin was prescribed for you because you have a blood clot or a medical condition that can cause an increased risk of forming blood clots. Blood clots can cause serious health problems by blocking the flow of blood to the heart, lung, or brain. Coumadin can prevent harmful blood clots from forming. As a reminder your indication for Coumadin is:   Blood Clot Prevention After Heart Valve Surgery  What test will check on my response to Coumadin? While on Coumadin (warfarin) you will need to have an INR test regularly  to ensure that your dose is keeping you in the desired range. The INR (international normalized ratio) number is calculated from the result of the laboratory  test called prothrombin time (PT).  If an INR APPOINTMENT HAS NOT ALREADY BEEN MADE FOR YOU please schedule an appointment to have this lab work done by your health care provider within 7 days. Your INR goal is usually a number between:  2 to 3 or your provider may give you a more narrow range like 2-2.5.  Ask your health care provider during an office visit what your goal INR is.  What  do you need to  know  About  COUMADIN? Take Coumadin (warfarin) exactly as prescribed by your healthcare provider about the same time each day.  DO NOT stop taking without talking to the doctor who prescribed the medication.  Stopping without other blood clot prevention medication to take the place of Coumadin may increase your risk of developing a new clot or stroke.  Get refills before you run out.  What do you do if you miss a dose? If you miss a dose, take it as soon as you remember on the same day then continue your regularly scheduled regimen the next day.  Do not take two doses of Coumadin at the same time.  Important Safety Information A possible side effect of Coumadin (Warfarin) is an increased risk of bleeding. You should call your healthcare provider right away if you experience any of the following: ? Bleeding from an injury or your nose that does not stop. ? Unusual colored urine (red or dark brown) or unusual colored stools (red or black). ? Unusual bruising for unknown reasons. ? A serious fall or if you hit your head (even if there is no bleeding).  Some foods or medicines interact with Coumadin (warfarin) and might alter your response to warfarin. To help avoid this: ? Eat a balanced diet, maintaining a consistent amount of Vitamin K. ? Notify your provider about major diet changes you plan to make. ? Avoid alcohol or limit your intake to  1 drink for women and 2 drinks for men per day. (1 drink is 5 oz. wine, 12 oz. beer, or 1.5 oz. liquor.)  Make sure that ANY health care provider who prescribes medication for you knows that you are taking Coumadin (warfarin).  Also make sure the healthcare provider who is monitoring your Coumadin knows when you have started a new medication including herbals and non-prescription products.  Coumadin (Warfarin)  Major Drug Interactions  Increased Warfarin Effect Decreased Warfarin Effect  Alcohol (large quantities) Antibiotics (esp. Septra/Bactrim, Flagyl, Cipro) Amiodarone (Cordarone) Aspirin (ASA) Cimetidine (Tagamet) Megestrol (Megace) NSAIDs (ibuprofen, naproxen, etc.) Piroxicam (Feldene) Propafenone (Rythmol SR) Propranolol (Inderal) Isoniazid (INH) Posaconazole (Noxafil) Barbiturates (Phenobarbital) Carbamazepine (Tegretol) Chlordiazepoxide (Librium) Cholestyramine (Questran) Griseofulvin Oral Contraceptives Rifampin Sucralfate (Carafate) Vitamin K   Coumadin (Warfarin) Major Herbal Interactions  Increased Warfarin Effect Decreased Warfarin Effect  Garlic Ginseng Ginkgo biloba Coenzyme Q10 Green tea St. Johns wort    Coumadin (Warfarin) FOOD Interactions  Eat a consistent number of servings per week of foods HIGH in Vitamin K (1 serving =  cup)  Collards (cooked, or boiled & drained) Kale (cooked, or boiled & drained) Mustard greens (cooked, or boiled & drained) Parsley *serving size only =  cup Spinach (cooked, or boiled & drained) Swiss chard (cooked, or boiled & drained) Turnip greens (cooked, or boiled & drained)  Eat a consistent number of servings per week of foods MEDIUM-HIGH in Vitamin K (1 serving = 1 cup)  Asparagus (cooked, or boiled & drained) Broccoli (cooked, boiled & drained, or raw & chopped) Brussel sprouts (cooked,  or boiled & drained) *serving size only =  cup Lettuce, raw (green leaf, endive, romaine) Spinach, raw Turnip greens, raw  & chopped   These websites have more information on Coumadin (warfarin):  http://www.king-russell.com/; https://www.hines.net/;

## 2017-07-27 NOTE — Progress Notes (Addendum)
PHASE I CARDIAC REHAB  Pt ambulating in hallway independently x 4 this morning.  Ambulation deferred.  Education completed including post op self care, diet,  exercise, and medication compliance.  Pt oriented to outpatient cardiac rehab.  At pt request[facility]  Understanding verbalized

## 2017-07-28 ENCOUNTER — Inpatient Hospital Stay (HOSPITAL_COMMUNITY): Payer: PPO

## 2017-07-28 LAB — PROTIME-INR
INR: 1.14
Prothrombin Time: 14.6 seconds (ref 11.4–15.2)

## 2017-07-28 MED ORDER — WARFARIN SODIUM 5 MG PO TABS: 5.0000 mg | ORAL_TABLET | Freq: Every day | ORAL | 3 refills | Status: DC

## 2017-07-28 MED ORDER — TRAMADOL HCL 50 MG PO TABS: 50.0000 mg | ORAL_TABLET | ORAL | 0 refills | Status: DC | PRN

## 2017-07-28 MED ORDER — FUROSEMIDE 40 MG PO TABS: 40.0000 mg | ORAL_TABLET | Freq: Two times a day (BID) | ORAL | 0 refills | Status: DC

## 2017-07-28 MED ORDER — ASPIRIN 81 MG PO TBEC: 81.0000 mg | DELAYED_RELEASE_TABLET | Freq: Every day | ORAL | Status: DC

## 2017-07-28 MED ORDER — ACETAMINOPHEN 500 MG PO TABS: 1000.0000 mg | ORAL_TABLET | Freq: Four times a day (QID) | ORAL | 0 refills | Status: AC | PRN

## 2017-07-28 MED ORDER — METOPROLOL TARTRATE 25 MG PO TABS: 25.0000 mg | ORAL_TABLET | Freq: Two times a day (BID) | ORAL | 3 refills | Status: DC

## 2017-07-28 MED ORDER — POTASSIUM CHLORIDE CRYS ER 20 MEQ PO TBCR: 20.0000 meq | EXTENDED_RELEASE_TABLET | Freq: Two times a day (BID) | ORAL | 0 refills | Status: DC

## 2017-07-28 NOTE — Progress Notes (Signed)
Pt. Discharged to home  Pt. D/C'd awaiting ride Discharge information reviewed and given with printed Rx All personal belongings given to Pt.  Education discussed with teach-back  IV was d/c Tele d/c  On Q pump removed along with sutures. Pt. Will follow-up outpatient for CT suture removal.

## 2017-07-28 NOTE — Care Management Note (Signed)
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                Action/Plan: PTA pt lived at home with spouse- anticipate return home- CM to follow  Expected Discharge Date:  07/28/17               Expected Discharge Plan:  Home/Self Care  In-House Referral:  NA  Discharge planning Services  CM Consult  Post Acute Care Choice:  NA Choice offered to:  NA  DME Arranged:    DME Agency:     HH Arranged:    HH Agency:     Status of Service:  Completed, signed off  If discussed at Long Length of Stay Meetings, dates discussed:    Discharge Disposition: home/self care   Additional Comments:  07/28/17- 1045- [name] RN, CM - pt for d/c home - no CM needs noted for discharge.   [name], [name], RN 07/28/2017, 10:47 AM

## 2017-07-28 NOTE — Progress Notes (Addendum)
      [address]       [place] [id number]             [phone]      4 Days Post-Op Procedure(s) (LRB): MINIMALLY INVASIVE MITRAL VALVE REPAIR (MVR) (Right) TRANSESOPHAGEAL ECHOCARDIOGRAM (TEE) (N/A)   Subjective:  Mr. Craig Griffin has no complaints.  He states he is doing well and is ready to go home.  + ambulation  + BM  Objective: Vital signs in last 24 hours: Temp:  [98.4 F (36.9 C)-99.4 F (37.4 C)] 98.6 F (37 C) (08/05 0400) Pulse Rate:  [81-86] 81 (08/05 0400) Cardiac Rhythm: Normal sinus rhythm (08/04 1950) Resp:  [16-20] 17 (08/04 2100) BP: (133-159)/(81-102) 159/93 (08/05 0400) SpO2:  [93 %-98 %] 98 % (08/05 0400) Weight:  [233 lb 14.4 oz (106.1 kg)] 233 lb 14.4 oz (106.1 kg) (08/05 0437)  Intake/Output from previous day: 08/04 0701 - 08/05 0700 In: -  Out: 400 [Urine:400]  General appearance: alert, cooperative and no distress Heart: regular rate and rhythm Lungs: clear to auscultation bilaterally Abdomen: soft, non-tender; bowel sounds normal; no masses,  no organomegaly Extremities: edema trace Wound: clean and dry, groin incision with small area of mild hematoma  Lab Results:  Recent Labs  07/26/17 0359 07/27/17 0215  WBC 13.6* 13.5*  HGB 11.1* 11.5*  HCT 32.6* 34.0*  PLT 104* 133*   BMET:  Recent Labs  07/26/17 0359 07/27/17 0215  NA 135 137  K 3.7 4.1  CL 103 101  CO2 27 29  GLUCOSE 103* 121*  BUN 13 12  CREATININE 1.14 1.09  CALCIUM 7.1* 7.9*    PT/INR:  Recent Labs  07/28/17 0208  LABPROT 14.6  INR 1.14   ABG    Component Value Date/Time   PHART 7.331 (L) 07/24/2017 2157   HCO3 19.6 (L) 07/24/2017 2157   TCO2 21 07/24/2017 2157   ACIDBASEDEF 6.0 (H) 07/24/2017 2157   O2SAT 95.0 07/24/2017 2157   CBG (last 3)   Recent Labs  07/25/17 1925 07/25/17 2330 07/26/17 0346  GLUCAP 137* 134* 94    Assessment/Plan: S/P Procedure(s) (LRB): MINIMALLY INVASIVE MITRAL VALVE REPAIR (MVR)  (Right) TRANSESOPHAGEAL ECHOCARDIOGRAM (TEE) (N/A)  1. CV- NSR, + HTN -continue Lopressor, home benicar 2. Pulm- no acute issues, off oxygen, CXR with right basilar atelectasis, no definitive pneumothorax appreciated 3. REnal- creatinine has been stable, weight improving, continue Lasix taper 4. INR 1.14- continue 5 mg coumadin, for PT/INR check on Tuesday 5. Dispo- patient stable, will d/c home today   LOS: 4 days    BARRETT, ERIN 07/28/2017 Patient seen and examined, agree with above  Viviann SpareSteven C. Dorris FetchHendrickson, MD [facility] [phone]

## 2017-07-28 NOTE — Progress Notes (Signed)
Right groin hematoma assessed by RN and PA. Will be taken care of outpatient.

## 2017-07-29 MED FILL — Lidocaine HCl Local Soln Prefilled Syringe 100 MG/5ML (2%): INTRAMUSCULAR | Qty: 10 | Status: AC

## 2017-07-29 MED FILL — Mannitol IV Soln 20%: INTRAVENOUS | Qty: 250 | Status: AC

## 2017-07-31 DIAGNOSIS — Z9889 Other specified postprocedural states: Secondary | ICD-10-CM | POA: Diagnosis not present

## 2017-07-31 DIAGNOSIS — R791 Abnormal coagulation profile: Secondary | ICD-10-CM | POA: Diagnosis not present

## 2017-07-31 DIAGNOSIS — Z79899 Other long term (current) drug therapy: Secondary | ICD-10-CM | POA: Diagnosis not present

## 2017-07-31 DIAGNOSIS — Z7901 Long term (current) use of anticoagulants: Secondary | ICD-10-CM | POA: Diagnosis not present

## 2017-08-01 ENCOUNTER — Telehealth: Payer: Self-pay | Admitting: Pharmacist

## 2017-08-01 NOTE — Telephone Encounter (Signed)
LM with patient wife as patient was sleeping. She will have him call us back.

## 2017-08-01 NOTE — Telephone Encounter (Signed)
-----   Message from Domingo Madeiraaven N McClees, [place] sent at 07/30/2017 12:24 PM EDT ----- Regarding: New Coumadin Check  Hi Tresa EndoKelly this pt came in today for PT/INR check  post cardiac cath and CABG with Dr. Barry Dieneswens. I told him that he would need to come see you guys at the clinic... Pt is aware that someone will call to set up appt.

## 2017-08-02 NOTE — Telephone Encounter (Signed)
Spoke with patient who states that PCP has been managing INR since discharge. He reports that his next appt with them is Monday to have INR checked. Since PCP is managing he will not need to be established in our clinic. Advised to call if wished to transfer care here. He states appreciation and understanding.

## 2017-08-05 DIAGNOSIS — R791 Abnormal coagulation profile: Secondary | ICD-10-CM | POA: Diagnosis not present

## 2017-08-08 ENCOUNTER — Ambulatory Visit (INDEPENDENT_AMBULATORY_CARE_PROVIDER_SITE_OTHER): Payer: PPO | Admitting: Cardiology

## 2017-08-08 ENCOUNTER — Encounter: Payer: Self-pay | Admitting: Cardiology

## 2017-08-08 VITALS — BP 108/70 | HR 71 | Ht 67.0 in | Wt 227.1 lb

## 2017-08-08 DIAGNOSIS — I10 Essential (primary) hypertension: Secondary | ICD-10-CM | POA: Diagnosis not present

## 2017-08-08 DIAGNOSIS — I5032 Chronic diastolic (congestive) heart failure: Secondary | ICD-10-CM | POA: Diagnosis not present

## 2017-08-08 DIAGNOSIS — Z9889 Other specified postprocedural states: Secondary | ICD-10-CM

## 2017-08-08 NOTE — Patient Instructions (Signed)
Medication Instructions:  Your physician recommends that you continue on your current medications as directed. Please refer to the Current Medication list given to you today.  Labwork: None   Testing/Procedures: None   Follow-Up: Your physician recommends that you schedule a follow-up appointment in: 3 months   Any Other Special Instructions Will Be Listed Below (If Applicable).  Please note that any paperwork needing to be filled out by the provider will need to be addressed at the front desk prior to seeing the provider. Please note that any paperwork FMLA, Disability or other documents regarding health condition is subject to a $25.00 charge that must be received prior to completion of paperwork in the form of a money order or check.     If you need a refill on your cardiac medications before your next appointment, please call your pharmacy.  

## 2017-08-08 NOTE — Progress Notes (Signed)
Cardiology Office Note:    Date:  08/08/2017   ID:  Craig Griffin, DOB 09/17/1951, MRN [id number]  PCP:  Philemon KingdomProchnau, Caroline, MD  Cardiologist:  Garwin Brothersajan R Monzerrat Wellen, MD   Referring MD: Philemon KingdomProchnau, Caroline, MD    ASSESSMENT:    1. Chronic diastolic congestive heart failure (HCC)   2. Essential hypertension   3. S/P minimally invasive mitral valve repair    PLAN:    In order of problems listed above:  1. The patient is happy with the outcome of the surgery. He has another appointment coming up with his cardiovascular surgeon. He's had a successful mitral valve repair. I followed the echocardiogram report after the repair and it indicates a successful surgery and the patient is aware of this. He and his wife had multiple questions that were answered to their satisfaction. I specifically mentioned to them to ask Dr. Cornelius Moraswen as to what length of time they need to continue anticoagulation. 2. Blood pressure stable and the patient will be seen in follow-up appointment in 3 months or earlier if he has any concerns.   Medication Adjustments/Labs and Tests Ordered: Current medicines are reviewed at length with the patient today.  Concerns regarding medicines are outlined above.  No orders of the defined types were placed in this encounter.  No orders of the defined types were placed in this encounter.    History of Present Illness:    Craig Kirknthony L Bernardini is a 66 y.o. male who is being seen today for the evaluation of Post mitral valve repair with minimally invasive surgery [facility] at the request of Philemon KingdomProchnau, Caroline, MD. The patient was evaluated by me at the previous practice and underwent evaluation [facility] for mitral valve repair. The patient had issues with his insurance so [facility] and has undergone successful mitral valve minimal invasive surgery with mitral valve repair and annuloplasty ring placement. He is very  happy about the results of surgery. He denies any chest pain orthopnea or PND at any time. He is on anticoagulation with warfarin by his surgeon and followed by his primary care physician. At the time of my evaluation is alert awake oriented and in no distress. He is going to initiate cardiac rehabilitation in the next few days [facility].  Past Medical History:  Diagnosis Date  . Chronic diastolic congestive heart failure (HCC)   . Heart murmur   . HTN (hypertension)   . Mitral regurgitation due to cusp prolapse   . Mitral valve prolapse   . Osteoarthritis   . S/P minimally invasive mitral valve repair 07/24/2017   Complex valvuloplasty including triangular resection of posterior leaflet, artificial Gore-tex neochord placement x4 and 32 mm Sorin Memo 3D ring annuloplasty via right mini thoracotomy approach  . Severe mitral regurgitation     Past Surgical History:  Procedure Laterality Date  . MITRAL VALVE REPAIR Right 07/24/2017   Procedure: MINIMALLY INVASIVE MITRAL VALVE REPAIR (MVR);  Surgeon: Purcell Nailswen, Clarence H, MD;  [facility];  Service: Open Heart Surgery;  Laterality: Right;  . NEPHRECTOMY     right kidney   . RIGHT/LEFT HEART CATH AND CORONARY ANGIOGRAPHY N/A 07/19/2017   Procedure: Right/Left Heart Cath and Coronary Angiography;  Surgeon: SwazilandJordan, Peter M, MD;  [facility];  Service: Cardiovascular;  Laterality: N/A;  . TEE WITHOUT CARDIOVERSION N/A 07/24/2017   Procedure: TRANSESOPHAGEAL ECHOCARDIOGRAM (TEE);  Surgeon: Purcell Nailswen, Clarence H, MD;  Location: [facility] OR;  Service: Open Heart Surgery;  Laterality: N/A;    Current Medications: Current Meds  Medication Sig  . acetaminophen (TYLENOL) 500 MG tablet Take 2 tablets (1,000 mg total) by mouth every 6 (six) hours as needed.  Marland Kitchen amoxicillin (AMOXIL) 500 MG capsule Take 2,000 mg by mouth. 4 capsules...1 hr before any dental procedures  . aspirin EC 81 MG EC tablet Take 1 tablet (81 mg total) by mouth daily.  .  metoprolol tartrate (LOPRESSOR) 25 MG tablet Take 1 tablet (25 mg total) by mouth 2 (two) times daily.  Marland Kitchen olmesartan (BENICAR) 40 MG tablet Take 40 mg by mouth daily.   . traMADol (ULTRAM) 50 MG tablet Take 1-2 tablets (50-100 mg total) by mouth every 4 (four) hours as needed for moderate pain.  Marland Kitchen warfarin (COUMADIN) 5 MG tablet Take 1 tablet (5 mg total) by mouth daily at 6 PM. (Patient taking differently: Take 5 mg by mouth daily at 6 PM. Take 7.5 mg Monday and Tuesday.)     Allergies:   Patient has no known allergies.   Social History   Social History  . Marital status: Married    Spouse name: carole  . Number of children: 1  . Years of education: N/A   Occupational History  . superintendent    Social History Main Topics  . Smoking status: Former Smoker    Years: 10.00    Types: Cigars    Quit date: 12/24/1996  . Smokeless tobacco: Never Used  . Alcohol use Yes     Comment: 5 drinks per week, martini   . Drug use: No  . Sexual activity: Not Asked   Other Topics Concern  . None   Social History Narrative  . None     Family History: The patient's family history includes Brain cancer in his brother; Hypertension in his mother; Liver cancer in his mother; Melanoma in his father.  ROS:   Please see the history of present illness.    All other systems reviewed and are negative.  EKGs/Labs/Other Studies Reviewed:    The following studies were reviewed today: I reviewed records including operative notes extensively and discussed this with the patient. Questions were answered to her satisfaction.   Recent Labs: 07/22/2017: ALT 29 07/25/2017: Magnesium 2.3 07/27/2017: BUN 12; Creatinine, Ser 1.09; Hemoglobin 11.5; Platelets 133; Potassium 4.1; Sodium 137  Recent Lipid Panel No results found for: CHOL, TRIG, HDL, CHOLHDL, VLDL, LDLCALC, LDLDIRECT  Physical Exam:    VS:  BP 108/70   Pulse 71   Ht 5\' 7"  (1.702 m)   Wt 227 lb 1.3 oz (103 kg)   SpO2 98%   BMI 35.57 kg/m      Wt Readings from Last 3 Encounters:  08/08/17 227 lb 1.3 oz (103 kg)  07/28/17 233 lb 14.4 oz (106.1 kg)  07/23/17 238 lb (108 kg)     GEN: Patient is in no acute distress HEENT: Normal NECK: No JVD; No carotid bruits LYMPHATICS: No lymphadenopathy CARDIAC: S1 S2 regular, 2/6 systolic murmur at the apex. RESPIRATORY:  Clear to auscultation without rales, wheezing or rhonchi  ABDOMEN: Soft, non-tender, non-distended MUSCULOSKELETAL:  No edema; No deformity  SKIN: Warm and dry NEUROLOGIC:  Alert and oriented x 3 PSYCHIATRIC:  Normal affect    Signed, Garwin Brothers, MD  08/08/2017 2:51 PM    [facility]

## 2017-08-09 ENCOUNTER — Telehealth: Payer: Self-pay

## 2017-08-09 ENCOUNTER — Other Ambulatory Visit: Payer: Self-pay | Admitting: Thoracic Surgery (Cardiothoracic Vascular Surgery)

## 2017-08-09 DIAGNOSIS — Z952 Presence of prosthetic heart valve: Secondary | ICD-10-CM

## 2017-08-09 NOTE — Telephone Encounter (Signed)
Dr. Tomie China has referred pt to have cardiac rehab at [facility]. Referral and notes faxed over. Call to make sure they were received and was told that pt was scheduled August 23rd at 9 am..

## 2017-08-12 ENCOUNTER — Ambulatory Visit
Admission: RE | Admit: 2017-08-12 | Discharge: 2017-08-12 | Disposition: A | Discharge status: Home / Self Care | Payer: PPO | Source: Ambulatory Visit | Attending: Thoracic Surgery (Cardiothoracic Vascular Surgery) | Admitting: Thoracic Surgery (Cardiothoracic Vascular Surgery)

## 2017-08-12 ENCOUNTER — Ambulatory Visit (INDEPENDENT_AMBULATORY_CARE_PROVIDER_SITE_OTHER): Payer: Self-pay | Admitting: Surgical

## 2017-08-12 VITALS — BP 143/90 | HR 68 | Resp 20 | Ht 67.0 in | Wt 230.0 lb

## 2017-08-12 DIAGNOSIS — R791 Abnormal coagulation profile: Secondary | ICD-10-CM | POA: Diagnosis not present

## 2017-08-12 DIAGNOSIS — J9811 Atelectasis: Secondary | ICD-10-CM | POA: Diagnosis not present

## 2017-08-12 DIAGNOSIS — I341 Nonrheumatic mitral (valve) prolapse: Secondary | ICD-10-CM

## 2017-08-12 DIAGNOSIS — Z9889 Other specified postprocedural states: Secondary | ICD-10-CM

## 2017-08-12 DIAGNOSIS — Z952 Presence of prosthetic heart valve: Secondary | ICD-10-CM

## 2017-08-12 DIAGNOSIS — Z7901 Long term (current) use of anticoagulants: Secondary | ICD-10-CM | POA: Diagnosis not present

## 2017-08-12 DIAGNOSIS — I340 Nonrheumatic mitral (valve) insufficiency: Secondary | ICD-10-CM

## 2017-08-12 NOTE — Progress Notes (Signed)
[address]       [place] [address]             [phone]                  EDIN K[facility] Medical Record #[id number] Date of Birth: 06-02-1951  Referring OZ:[id number], Aundra Dubin, MD Primary Cardiology: Primary Care:Prochnau, Rayfield Citizen, MD  Chief Complaint:  Follow Up Visit CARDIOTHORACIC SURGERY OPERATIVE NOTE  Date of Procedure:                07/24/2017  Preoperative Diagnosis:      Severe Mitral Regurgitation  Postoperative Diagnosis:    Same  Procedure:        Minimally-Invasive Mitral Valve Repair             Complex valvuloplasty including triangular resection of flail segment (P2) of posterior leaflet             Artificial Gore-tex neochord placement x4             Sorin Memo 3D Ring Annuloplasty (size 32mm, catalog # [id number], serial # [id number])               Surgeon:        Salvatore Decent. 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 Overall, he feels as though he is doing quite well. He has had no significant difficulty with his incisions. He denies significant pain is only taking Tylenol. He denies fevers, chills or other constitutional symptoms. He is not having any significant shortness of breath. His ambulation is showing a steady improvement in the is supposed to start cardiac rehabilitation this week. He has followed up with cardiology and they're managing his  Coumadin.       Zubrod Score: At the time of surgery this patient's most appropriate activity status/level should be described as: [x]     0    Normal activity, no symptoms []     1    Restricted in physical strenuous activity but ambulatory, able to do out light work []     2    Ambulatory and capable of self care, unable to do work activities, up and about                 >50 % of waking hours                                                                                   []     3    Only limited self care, in bed greater than 50% of waking hours []   4    Completely disabled, no self care, confined to bed or chair []     5    Moribund  History  Smoking Status  . Former Smoker  . Years: 10.00  . Types: Cigars  . Quit date: 12/24/1996  Smokeless Tobacco  . Never Used       No Known Allergies  Current Outpatient Prescriptions  Medication Sig Dispense Refill  . acetaminophen (TYLENOL) 500 MG tablet Take 2 tablets (1,000 mg total) by mouth every 6 (six) hours as needed. 30 tablet 0  . amoxicillin (AMOXIL) 500 MG capsule Take 2,000 mg by mouth. 4 capsules...1 hr before any dental procedures    . aspirin EC 81 MG EC tablet Take 1 tablet (81 mg total) by mouth daily.    . metoprolol tartrate (LOPRESSOR) 25 MG tablet Take 1 tablet (25 mg total) by mouth 2 (two) times daily. 60 tablet 3  . olmesartan (BENICAR) 40 MG tablet Take 40 mg by mouth daily.     . traMADol (ULTRAM) 50 MG tablet Take 1-2 tablets (50-100 mg total) by mouth every 4 (four) hours as needed for moderate pain. 30 tablet 0  . warfarin (COUMADIN) 5 MG tablet Take 1 tablet (5 mg total) by mouth daily at 6 PM. (Patient taking differently: Take 5 mg by mouth daily at 6 PM. Take 7.5 mg Monday and Tuesday.) 30 tablet 3   No current facility-administered medications for this visit.        Physical Exam: BP (!) 143/90   Pulse 68   Resp 20   Ht 5\' 7"  (1.702 m)   Wt 230 lb (104.3 kg)   SpO2 98% Comment: RA  BMI 36.02  kg/m   General appearance: alert, cooperative and no distress Heart: regular rate and rhythm, S1, S2 normal, no murmur, click, rub or gallop Lungs: clear to auscultation bilaterally Abdomen: soft, non-tender; bowel sounds normal; no masses,  no organomegaly Extremities: Trace edema Wounds: Incisions healing well without evidence of infection.  Diagnostic Studies & Laboratory data:         Recent Radiology Findings: Dg Chest 2 View  Result Date: 08/12/2017 CLINICAL DATA:  Status post mitral valve replacement. EXAM: CHEST  2 VIEW COMPARISON:  07/28/2017 FINDINGS: Unchanged elevation of the anterior right diaphragm. Clearing atelectatic type opacities on the right. Pneumomediastinum or pneumothorax and chest wall emphysema seen previously is no longer visualized. Stable heart size and mediastinal contours. IMPRESSION: Persistent elevation of the right diaphragm. Decreased atelectasis and resolved anterior pneumothorax. Electronically Signed   By: Marnee Spring M.D.   On: 08/12/2017 13:06      I have independently reviewed the above radiology findings and reviewed findings  with the patient.  Recent Labs: Lab Results  Component Value Date   WBC 13.5 (H) 07/27/2017   HGB 11.5 (L) 07/27/2017   HCT 34.0 (L) 07/27/2017   PLT 133 (L) 07/27/2017   GLUCOSE 121 (H) 07/27/2017   ALT 29 07/22/2017   AST 29 07/22/2017   NA 137 07/27/2017   K 4.1 07/27/2017   CL 101 07/27/2017   CREATININE 1.09 07/27/2017   BUN 12 07/27/2017   CO2 29 07/27/2017   INR 1.14 07/28/2017   HGBA1C 5.5 07/22/2017      Assessment / Plan:  The patient is doing quite well. He is essentially asymptomatic in regard to his cardiac status. We discussed routine postop activity progression. He will continue Coumadin for now and he is being monitored by cardiology. We  will see him again in 3 months.         Janani Chamber E 08/12/2017 1:38 PM

## 2017-08-12 NOTE — Patient Instructions (Signed)
Discussed driving and activity progression with the patient has a good understanding.

## 2017-08-13 DIAGNOSIS — Z952 Presence of prosthetic heart valve: Secondary | ICD-10-CM | POA: Diagnosis not present

## 2017-08-13 DIAGNOSIS — Z955 Presence of coronary angioplasty implant and graft: Secondary | ICD-10-CM | POA: Diagnosis not present

## 2017-08-19 DIAGNOSIS — R791 Abnormal coagulation profile: Secondary | ICD-10-CM | POA: Diagnosis not present

## 2017-08-19 DIAGNOSIS — Z7901 Long term (current) use of anticoagulants: Secondary | ICD-10-CM | POA: Diagnosis not present

## 2017-08-27 DIAGNOSIS — Z952 Presence of prosthetic heart valve: Secondary | ICD-10-CM | POA: Diagnosis not present

## 2017-08-27 DIAGNOSIS — R791 Abnormal coagulation profile: Secondary | ICD-10-CM | POA: Diagnosis not present

## 2017-08-30 DIAGNOSIS — Z7901 Long term (current) use of anticoagulants: Secondary | ICD-10-CM | POA: Diagnosis not present

## 2017-09-06 DIAGNOSIS — Z7901 Long term (current) use of anticoagulants: Secondary | ICD-10-CM | POA: Diagnosis not present

## 2017-09-09 DIAGNOSIS — Z7901 Long term (current) use of anticoagulants: Secondary | ICD-10-CM | POA: Diagnosis not present

## 2017-09-13 DIAGNOSIS — I340 Nonrheumatic mitral (valve) insufficiency: Secondary | ICD-10-CM | POA: Diagnosis not present

## 2017-09-13 DIAGNOSIS — I10 Essential (primary) hypertension: Secondary | ICD-10-CM | POA: Diagnosis not present

## 2017-09-13 DIAGNOSIS — Z7901 Long term (current) use of anticoagulants: Secondary | ICD-10-CM | POA: Diagnosis not present

## 2017-09-13 DIAGNOSIS — Z79899 Other long term (current) drug therapy: Secondary | ICD-10-CM | POA: Diagnosis not present

## 2017-09-23 DIAGNOSIS — Z952 Presence of prosthetic heart valve: Secondary | ICD-10-CM | POA: Diagnosis not present

## 2017-09-23 DIAGNOSIS — R791 Abnormal coagulation profile: Secondary | ICD-10-CM | POA: Diagnosis not present

## 2017-09-23 DIAGNOSIS — I10 Essential (primary) hypertension: Secondary | ICD-10-CM | POA: Diagnosis not present

## 2017-09-30 DIAGNOSIS — R791 Abnormal coagulation profile: Secondary | ICD-10-CM | POA: Diagnosis not present

## 2017-10-07 DIAGNOSIS — R791 Abnormal coagulation profile: Secondary | ICD-10-CM | POA: Diagnosis not present

## 2017-10-11 DIAGNOSIS — Z7901 Long term (current) use of anticoagulants: Secondary | ICD-10-CM | POA: Diagnosis not present

## 2017-10-21 DIAGNOSIS — Z7901 Long term (current) use of anticoagulants: Secondary | ICD-10-CM | POA: Diagnosis not present

## 2017-10-25 DIAGNOSIS — Z952 Presence of prosthetic heart valve: Secondary | ICD-10-CM | POA: Diagnosis not present

## 2017-10-28 DIAGNOSIS — Z7901 Long term (current) use of anticoagulants: Secondary | ICD-10-CM | POA: Diagnosis not present

## 2017-11-04 DIAGNOSIS — Z7901 Long term (current) use of anticoagulants: Secondary | ICD-10-CM | POA: Diagnosis not present

## 2017-11-07 ENCOUNTER — Encounter: Payer: Self-pay | Admitting: Cardiology

## 2017-11-07 ENCOUNTER — Ambulatory Visit (INDEPENDENT_AMBULATORY_CARE_PROVIDER_SITE_OTHER): Payer: PPO | Admitting: Cardiology

## 2017-11-07 VITALS — BP 138/94 | HR 93 | Ht 67.0 in | Wt 231.1 lb

## 2017-11-07 DIAGNOSIS — I10 Essential (primary) hypertension: Secondary | ICD-10-CM

## 2017-11-07 DIAGNOSIS — Z9889 Other specified postprocedural states: Secondary | ICD-10-CM

## 2017-11-07 MED ORDER — METOPROLOL TARTRATE 25 MG PO TABS: 25.0000 mg | ORAL_TABLET | Freq: Two times a day (BID) | ORAL | 3 refills | Status: DC

## 2017-11-07 NOTE — Addendum Note (Signed)
Addended by: Domingo MadeiraMCCLEES, RAVEN N on: 11/07/2017 02:14 PM   Modules accepted: Orders

## 2017-11-07 NOTE — Progress Notes (Signed)
Cardiology Office Note:    Date:  11/07/2017   ID:  Craig Kirknthony L Sinquefield, DOB 1951/09/11, MRN [id number]  PCP:  Philemon KingdomProchnau, Caroline, MD  Cardiologist:  Garwin Brothersajan R Petr Bontempo, MD   Referring MD: Philemon KingdomProchnau, Caroline, MD    ASSESSMENT:    1. Essential hypertension   2. S/P minimally invasive mitral valve repair    PLAN:    In order of problems listed above:  1. I discussed my findings with the patient had extensive length and he vocalized understanding.  Questions were answered to her satisfaction.  His heart rate and blood pressure is elevated and therefore I have reinitiated him on metoprolol 25 mg twice daily.  He will be seeing his cardiac surgeon in the next few days and will be getting a vital signs evaluation there and will get in touch with us if there are any significant issues with this.  We have sent a prescription for the beta-blocker.  When he sees his surgeon decision also will be made about continuing or discontinuing anticoagulation. 2. Patient will be seen in follow-up appointment in 6 months or earlier if he has any concerns.  I told him to get his lab work from his primary care physician at that time.   Medication Adjustments/Labs and Tests Ordered: Current medicines are reviewed at length with the patient today.  Concerns regarding medicines are outlined above.  No orders of the defined types were placed in this encounter.  Meds ordered this encounter  Medications  . metoprolol tartrate (LOPRESSOR) 25 MG tablet    Sig: Take 1 tablet (25 mg total) 2 (two) times daily by mouth.    Dispense:  60 tablet    Refill:  3     Chief Complaint  Patient presents with  . Follow-up    No Concerns      History of Present Illness:    Craig Griffin is a 66 y.o. male.  Patient has past medical history of mitral valve repair and essential hypertension.  He denies any problems at this time and takes care of activities of daily living.  No chest pain orthopnea or PND.  He  exercises on a regular basis and also goes to cardiac rehab.  He denies any dizziness or any palpitations.  He stopped his beta-blocker on his own.  From the history he provides I figure that his resting heart rate before and after rest is significantly elevated and should be better than this and therefore I have advised him to start his beta-blocker again.  Past Medical History:  Diagnosis Date  . Chronic diastolic congestive heart failure (HCC)   . Heart murmur   . HTN (hypertension)   . Mitral regurgitation due to cusp prolapse   . Mitral valve prolapse   . Osteoarthritis   . S/P minimally invasive mitral valve repair 07/24/2017   Complex valvuloplasty including triangular resection of posterior leaflet, artificial Gore-tex neochord placement x4 and 32 mm Sorin Memo 3D ring annuloplasty via right mini thoracotomy approach  . Severe mitral regurgitation     Past Surgical History:  Procedure Laterality Date  . MITRAL VALVE REPAIR Right 07/24/2017   Procedure: MINIMALLY INVASIVE MITRAL VALVE REPAIR (MVR);  Surgeon: Purcell Nailswen, Clarence H, MD;  [facility];  Service: Open Heart Surgery;  Laterality: Right;  . NEPHRECTOMY     right kidney   . RIGHT/LEFT HEART CATH AND CORONARY ANGIOGRAPHY N/A 07/19/2017   Procedure: Right/Left Heart Cath and Coronary Angiography;  Surgeon: SwazilandJordan, Peter M,  MD;  Location: [facility] INVASIVE CV LAB;  Service: Cardiovascular;  Laterality: N/A;  . TEE WITHOUT CARDIOVERSION N/A 07/24/2017   Procedure: TRANSESOPHAGEAL ECHOCARDIOGRAM (TEE);  Surgeon: Purcell Nailswen, Clarence H, MD;  [facility];  Service: Open Heart Surgery;  Laterality: N/A;    Current Medications: Current Meds  Medication Sig  . acetaminophen (TYLENOL) 500 MG tablet Take 2 tablets (1,000 mg total) by mouth every 6 (six) hours as needed.  Marland Kitchen. amoxicillin (AMOXIL) 500 MG capsule Take 2,000 mg by mouth. 4 capsules...1 hr before any dental procedures  . olmesartan (BENICAR) 40 MG tablet Take 40 mg by mouth daily.   Marland Kitchen.  warfarin (COUMADIN) 5 MG tablet Take 1 tablet (5 mg total) by mouth daily at 6 PM. (Patient taking differently: Take 5 mg by mouth daily at 6 PM. Take 7.5 mg Monday and Tuesday.)     Allergies:   Patient has no known allergies.   Social History   Socioeconomic History  . Marital status: Married    Spouse name: carole  . Number of children: 1  . Years of education: None  . Highest education level: None  Social Needs  . Financial resource strain: None  . Food insecurity - worry: None  . Food insecurity - inability: None  . Transportation needs - medical: None  . Transportation needs - non-medical: None  Occupational History  . Occupation: Surveyor, quantitysuperintendent  Tobacco Use  . Smoking status: Former Smoker    Years: 10.00    Types: Cigars    Last attempt to quit: 12/24/1996    Years since quitting: 20.8  . Smokeless tobacco: Never Used  Substance and Sexual Activity  . Alcohol use: Yes    Comment: 5 drinks per week, martini   . Drug use: No  . Sexual activity: None  Other Topics Concern  . None  Social History Narrative  . None     Family History: The patient's family history includes Brain cancer in his brother; Hypertension in his mother; Liver cancer in his mother; Melanoma in his father.  ROS:   Please see the history of present illness.    All other systems reviewed and are negative.  EKGs/Labs/Other Studies Reviewed:    The following studies were reviewed today: I discussed my findings with the patient extensively.  Lab work and office visit records were reviewed.  His lipids are followed by his primary care physician.   Recent Labs: 07/22/2017: ALT 29 07/25/2017: Magnesium 2.3 07/27/2017: BUN 12; Creatinine, Ser 1.09; Hemoglobin 11.5; Platelets 133; Potassium 4.1; Sodium 137  Recent Lipid Panel No results found for: CHOL, TRIG, HDL, CHOLHDL, VLDL, LDLCALC, LDLDIRECT  Physical Exam:    VS:  BP (!) 138/94   Pulse 93   Ht 5\' 7"  (1.702 m)   Wt 231 lb 1.9 oz (104.8  kg)   SpO2 94%   BMI 36.20 kg/m     Wt Readings from Last 3 Encounters:  11/07/17 231 lb 1.9 oz (104.8 kg)  08/12/17 230 lb (104.3 kg)  08/08/17 227 lb 1.3 oz (103 kg)     GEN: Patient is in no acute distress HEENT: Normal NECK: No JVD; No carotid bruits LYMPHATICS: No lymphadenopathy CARDIAC: Hear sounds regular, 2/6 systolic murmur at the apex. RESPIRATORY:  Clear to auscultation without rales, wheezing or rhonchi  ABDOMEN: Soft, non-tender, non-distended MUSCULOSKELETAL:  No edema; No deformity  SKIN: Warm and dry NEUROLOGIC:  Alert and oriented x 3 PSYCHIATRIC:  Normal affect   Signed, Aundra Dubinajan R Inaaya Vellucci,  MD  11/07/2017 2:01 PM    [facility]

## 2017-11-07 NOTE — Patient Instructions (Signed)
Medication Instructions:  Your physician has recommended you make the following change in your medication: START Metoprolol 25 mg twice a day   Labwork: None  Testing/Procedures: None  Follow-Up: Your physician recommends that you schedule a follow-up appointment in: 6 months   Any Other Special Instructions Will Be Listed Below (If Applicable).     If you need a refill on your cardiac medications before your next appointment, please call your pharmacy.   [facility]  Garey HamAshley A, RN, BSN

## 2017-11-08 ENCOUNTER — Other Ambulatory Visit: Payer: Self-pay | Admitting: Thoracic Surgery (Cardiothoracic Vascular Surgery)

## 2017-11-08 DIAGNOSIS — Z9889 Other specified postprocedural states: Secondary | ICD-10-CM

## 2017-11-11 ENCOUNTER — Ambulatory Visit
Admission: RE | Admit: 2017-11-11 | Discharge: 2017-11-11 | Disposition: A | Discharge status: Home / Self Care | Payer: PPO | Source: Ambulatory Visit | Attending: Thoracic Surgery (Cardiothoracic Vascular Surgery) | Admitting: Thoracic Surgery (Cardiothoracic Vascular Surgery)

## 2017-11-11 ENCOUNTER — Ambulatory Visit: Payer: PPO | Admitting: Thoracic Surgery (Cardiothoracic Vascular Surgery)

## 2017-11-11 ENCOUNTER — Encounter: Payer: Self-pay | Admitting: Thoracic Surgery (Cardiothoracic Vascular Surgery)

## 2017-11-11 ENCOUNTER — Other Ambulatory Visit: Payer: Self-pay

## 2017-11-11 VITALS — BP 139/97 | HR 65 | Ht 67.0 in | Wt 230.0 lb

## 2017-11-11 DIAGNOSIS — Z954 Presence of other heart-valve replacement: Secondary | ICD-10-CM | POA: Diagnosis not present

## 2017-11-11 DIAGNOSIS — Z9889 Other specified postprocedural states: Secondary | ICD-10-CM | POA: Diagnosis not present

## 2017-11-11 MED ORDER — ASPIRIN EC 81 MG PO TBEC: 81.0000 mg | DELAYED_RELEASE_TABLET | Freq: Every day | ORAL | Status: AC

## 2017-11-11 NOTE — Patient Instructions (Signed)
You may stop taking Coumadin (warfarin) but then resume taking aspirin 81 mg/day  Continue all other previous medications without any changes at this time  You may resume unrestricted physical activity without any particular limitations at this time.  Endocarditis is a potentially serious infection of heart valves or inside lining of the heart.  It occurs more commonly in patients with diseased heart valves (such as patient's with aortic or mitral valve disease) and in patients who have undergone heart valve repair or replacement.  Certain surgical and dental procedures may put you at risk, such as dental cleaning, other dental procedures, or any surgery involving the respiratory, urinary, gastrointestinal tract, gallbladder or prostate gland.   To minimize your chances for develooping endocarditis, maintain good oral health and seek prompt medical attention for any infections involving the mouth, teeth, gums, skin or urinary tract.    Always notify your doctor or dentist about your underlying heart valve condition before having any invasive procedures. You will need to take antibiotics before certain procedures, including all routine dental cleanings or other dental procedures.  Your cardiologist or dentist should prescribe these antibiotics for you to be taken ahead of time.

## 2017-11-11 NOTE — Progress Notes (Signed)
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  His postoperative recovery was uneventful and he was last seen here in our office on August 12, 2017 at which time he was doing well.  He was seen in follow-up recently by Dr. Tomie Chinaevankar.  At that time the patient was notably hypertensive and tachycardic, and the patient had stopped taking metoprolol on his own without being instructed.  Metoprolol was resumed.  He remains anticoagulated using warfarin.  He returns her office today and reports that he is doing exceptionally well.  He is actively participating in the cardiac rehab program he reports that his exercise tolerance is quite good.  He notes that his breathing is considerably better than it was prior to surgery.  He no longer has any soreness in his chest and he is back to unrestricted physical activity.  Overall he is delighted with his progress.   Current Outpatient Medications  Medication Sig Dispense Refill  . acetaminophen (TYLENOL) 500 MG tablet Take 2 tablets (1,000 mg total) by mouth every 6 (six) hours as needed. 30 tablet 0  . metoprolol tartrate (LOPRESSOR) 25 MG tablet Take 1 tablet (25 mg total) 2 (two) times daily by mouth. 60 tablet 3  . olmesartan (BENICAR) 40 MG tablet Take 40 mg by mouth daily.     Marland Kitchen. warfarin (COUMADIN) 5 MG tablet Take 1 tablet (5 mg total) by mouth daily at 6 PM. (Patient taking differently: Take 5 mg as directed by mouth. ) 30 tablet 3  . amoxicillin (AMOXIL) 500 MG capsule Take 2,000 mg by mouth. 4 capsules...1 hr  before any dental procedures     No current facility-administered medications for this visit.       Physical Exam:   BP (!) 139/97   Pulse 65   Ht 5\' 7"  (1.702 m)   Wt 230 lb (104.3 kg)   SpO2 99%   BMI 36.02 kg/m   General:  Well-appearing  Chest:   Clear to auscultation  CV:   Regular rate and rhythm without murmur  Incisions:  Completely healed  Abdomen:  Soft nontender  Extremities:  Warm and well perfused  Diagnostic Tests:  CHEST  2 VIEW  COMPARISON:  Multiple prior plain films of the chest, last 08/12/2017.  FINDINGS: Eventration of the anterior right hemidiaphragm is seen. There is some scar in the right middle and upper lobes. No consolidative process, pneumothorax or effusion. Heart size is upper normal. Mitral valve replacement noted. No acute bony abnormality.  IMPRESSION: No acute disease.   Electronically Signed   By: Drusilla Kannerhomas  Dalessio M.D.   On: 11/11/2017 13:20   Impression:  Patient is doing very well approximately 3 months status post minimally invasive mitral valve repair  Plan:  I have instructed the patient to stop taking warfarin and resume taking aspirin 81 mg daily.  We have otherwise not recommended any changes to his current medications.  We will obtain a routine follow-up echocardiogram to reassess left ventricular function  following mitral valve repair.  I have encouraged patient to continue to increase his physical activity without any particular limitations at this time.  The patient has been reminded regarding the importance of dental hygiene and the lifelong need for antibiotic prophylaxis for all dental cleanings and other related invasive procedures.  Patient will continue to follow-up with Dr. Tomie Chinaevankar and to return to our office for routine follow-up next August, approximately 1 year following his surgery.  All of his questions have been addressed.  I spent in excess of 15 minutes during the conduct of this office  consultation and >50% of this time involved direct face-to-face encounter with the patient for counseling and/or coordination of their care.    Salvatore Decentlarence H. Cornelius Moraswen, MD 11/11/2017 1:41 PM

## 2017-12-27 ENCOUNTER — Other Ambulatory Visit: Payer: Self-pay | Admitting: *Deleted

## 2017-12-27 DIAGNOSIS — Z9889 Other specified postprocedural states: Secondary | ICD-10-CM

## 2018-01-24 NOTE — Progress Notes (Signed)
This encounter was created in error - please disregard.

## 2018-01-28 DIAGNOSIS — I082 Rheumatic disorders of both aortic and tricuspid valves: Secondary | ICD-10-CM | POA: Diagnosis not present

## 2018-01-28 DIAGNOSIS — I340 Nonrheumatic mitral (valve) insufficiency: Secondary | ICD-10-CM | POA: Diagnosis not present

## 2018-02-03 ENCOUNTER — Other Ambulatory Visit (HOSPITAL_COMMUNITY): Payer: PPO

## 2018-02-03 ENCOUNTER — Other Ambulatory Visit: Payer: Self-pay

## 2018-02-03 MED ORDER — METOPROLOL TARTRATE 25 MG PO TABS: 25.0000 mg | ORAL_TABLET | Freq: Two times a day (BID) | ORAL | 1 refills | Status: AC

## 2018-02-11 IMAGING — DX DG CHEST 1V PORT
1 series · 1 of 1 positions shown · non-contrast
Comparison: A[DATE] and earlier.

CLINICAL DATA: 66-year-old male postop day 1 minimally invasive
mitral valve repair for severe symptomatic mitral valve
regurgitation.

EXAM:
PORTABLE CHEST 1 VIEW

[chest]
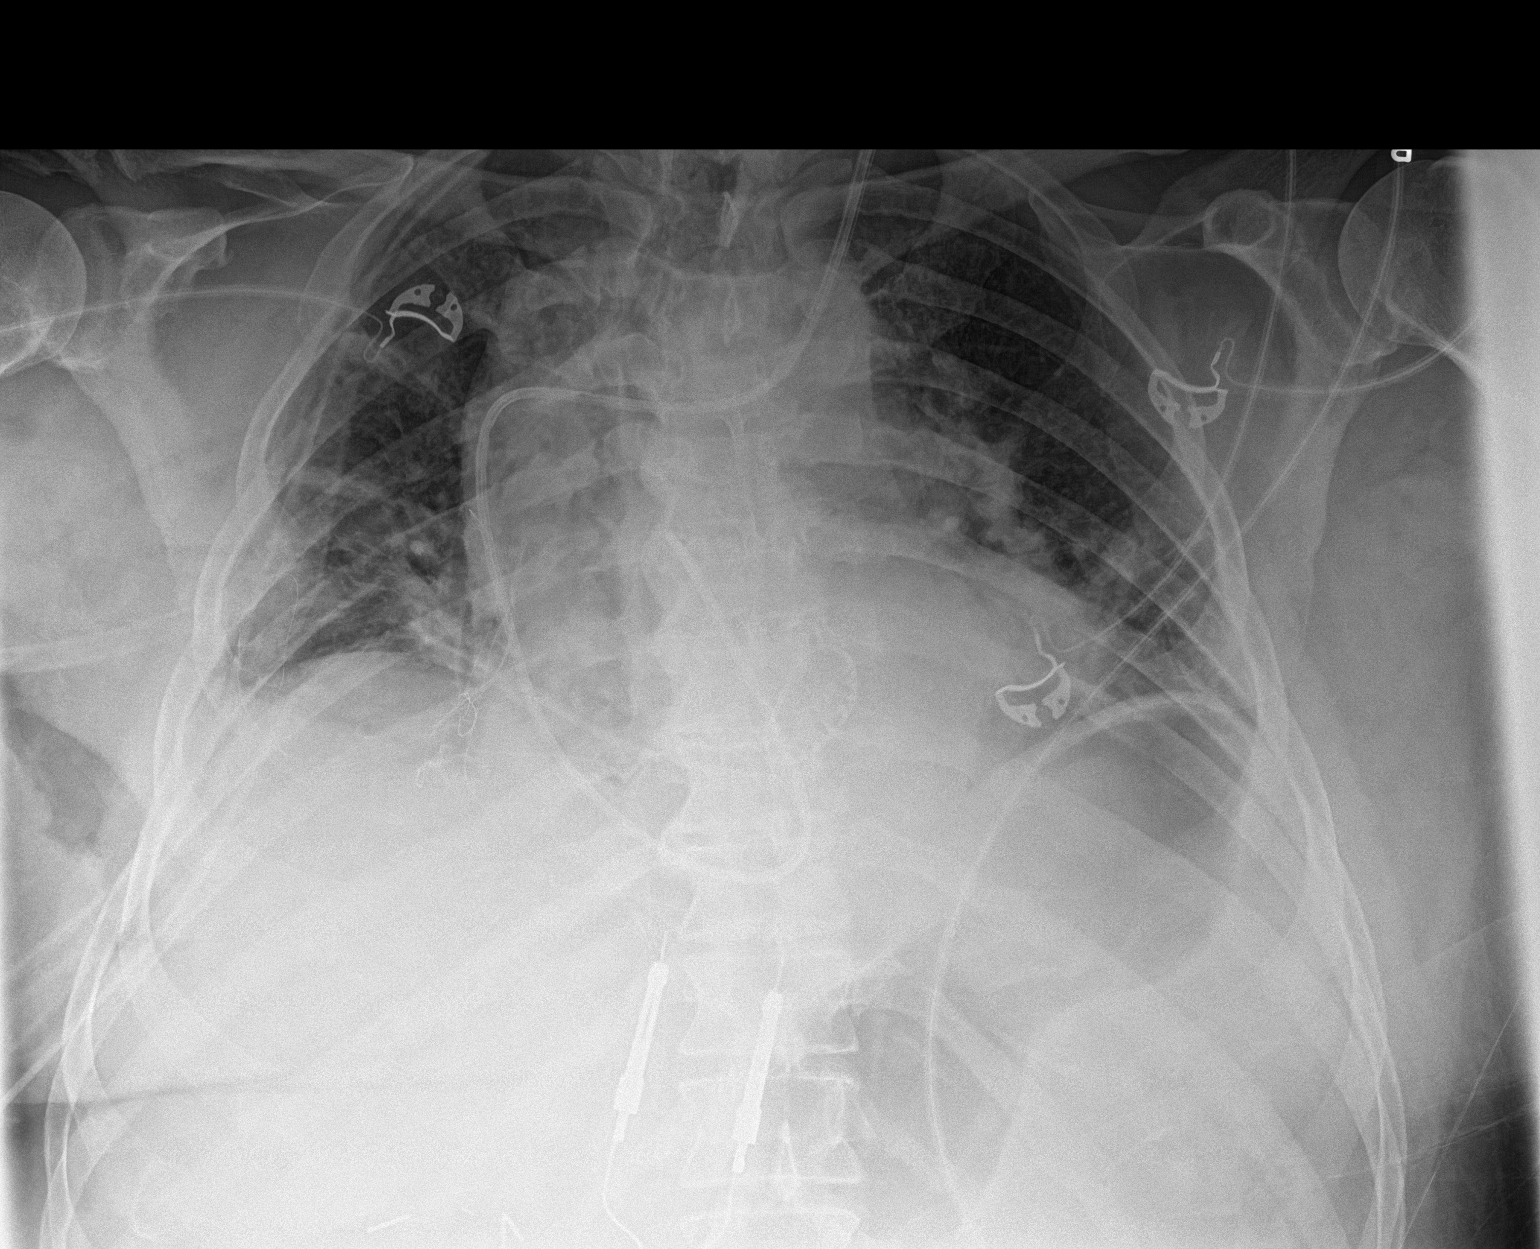

[1 of 1 positions shown; findings below may reference images not displayed]

FINDINGS: Portable AP upright view at [id number] hours. Left IJ approach Swan-Ganz
catheter tip is in stable position at the main pulmonary artery
level. Stable right chest tubes. Epicardial pacer wires remain.
Enteric tube has been removed. Extubated.

Lower lung volumes. No pneumothorax identified. Streaky opacity at
the lung bases greater on the right. Pulmonary vascularity remains
within normal limits. No pleural effusion or consolidation
identified. Stable cardiomegaly and mediastinal contours.

Mildly increased gaseous distension of the stomach.
IMPRESSION: 1. Extubated and enteric tube removed. Otherwise stable lines and
tubes.
2. Lower lung volumes with bibasilar opacity suspected to be
atelectasis.
3. No pneumothorax or pulmonary edema.

## 2018-03-01 IMAGING — DX DG CHEST 2V
2 series · 2 of 2 positions shown · non-contrast
Comparison: 07/28/2017

CLINICAL DATA: Status post mitral valve replacement.

EXAM:
CHEST  2 VIEW

[dg chest 2 view (1 of 2)]
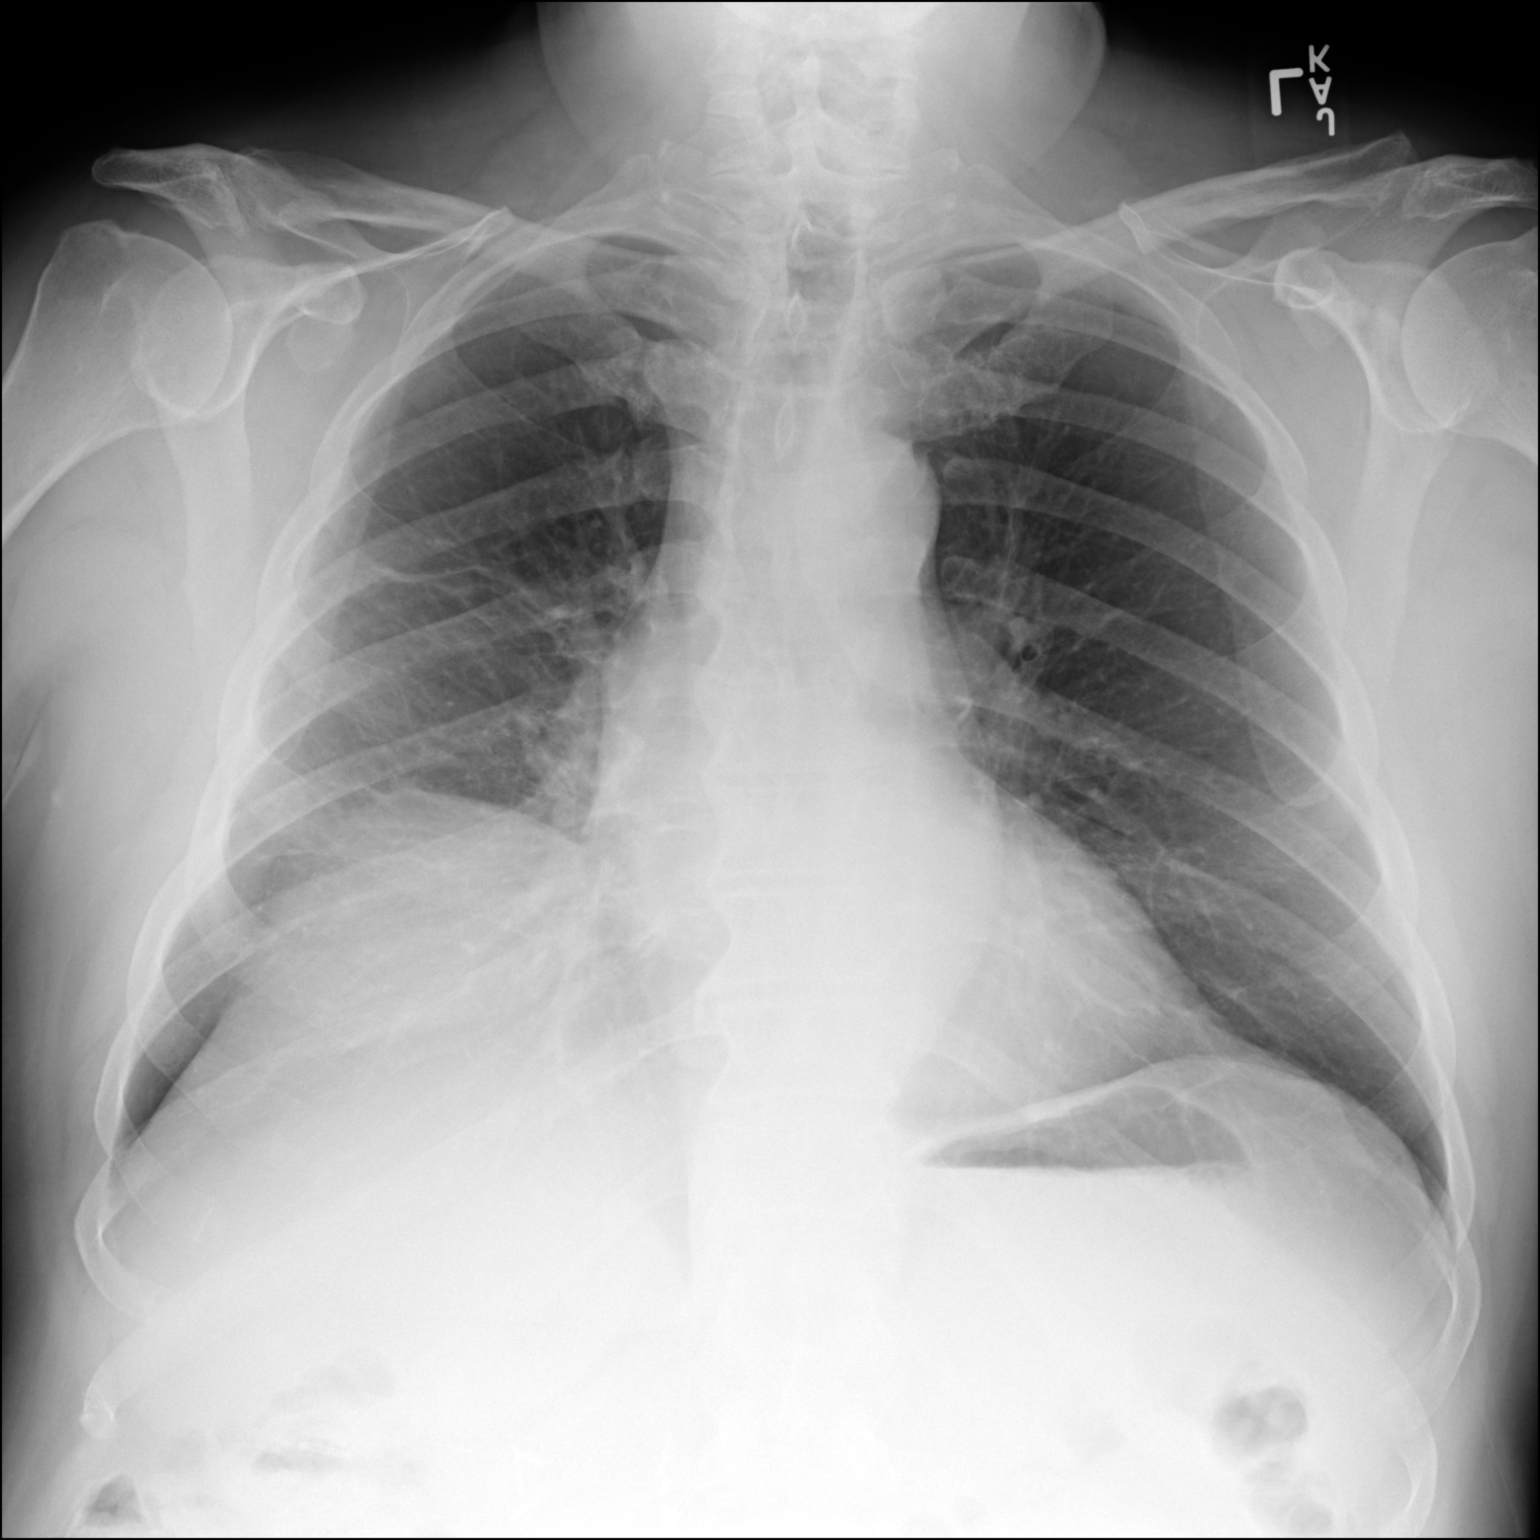

[dg chest 2 view (2 of 2)]
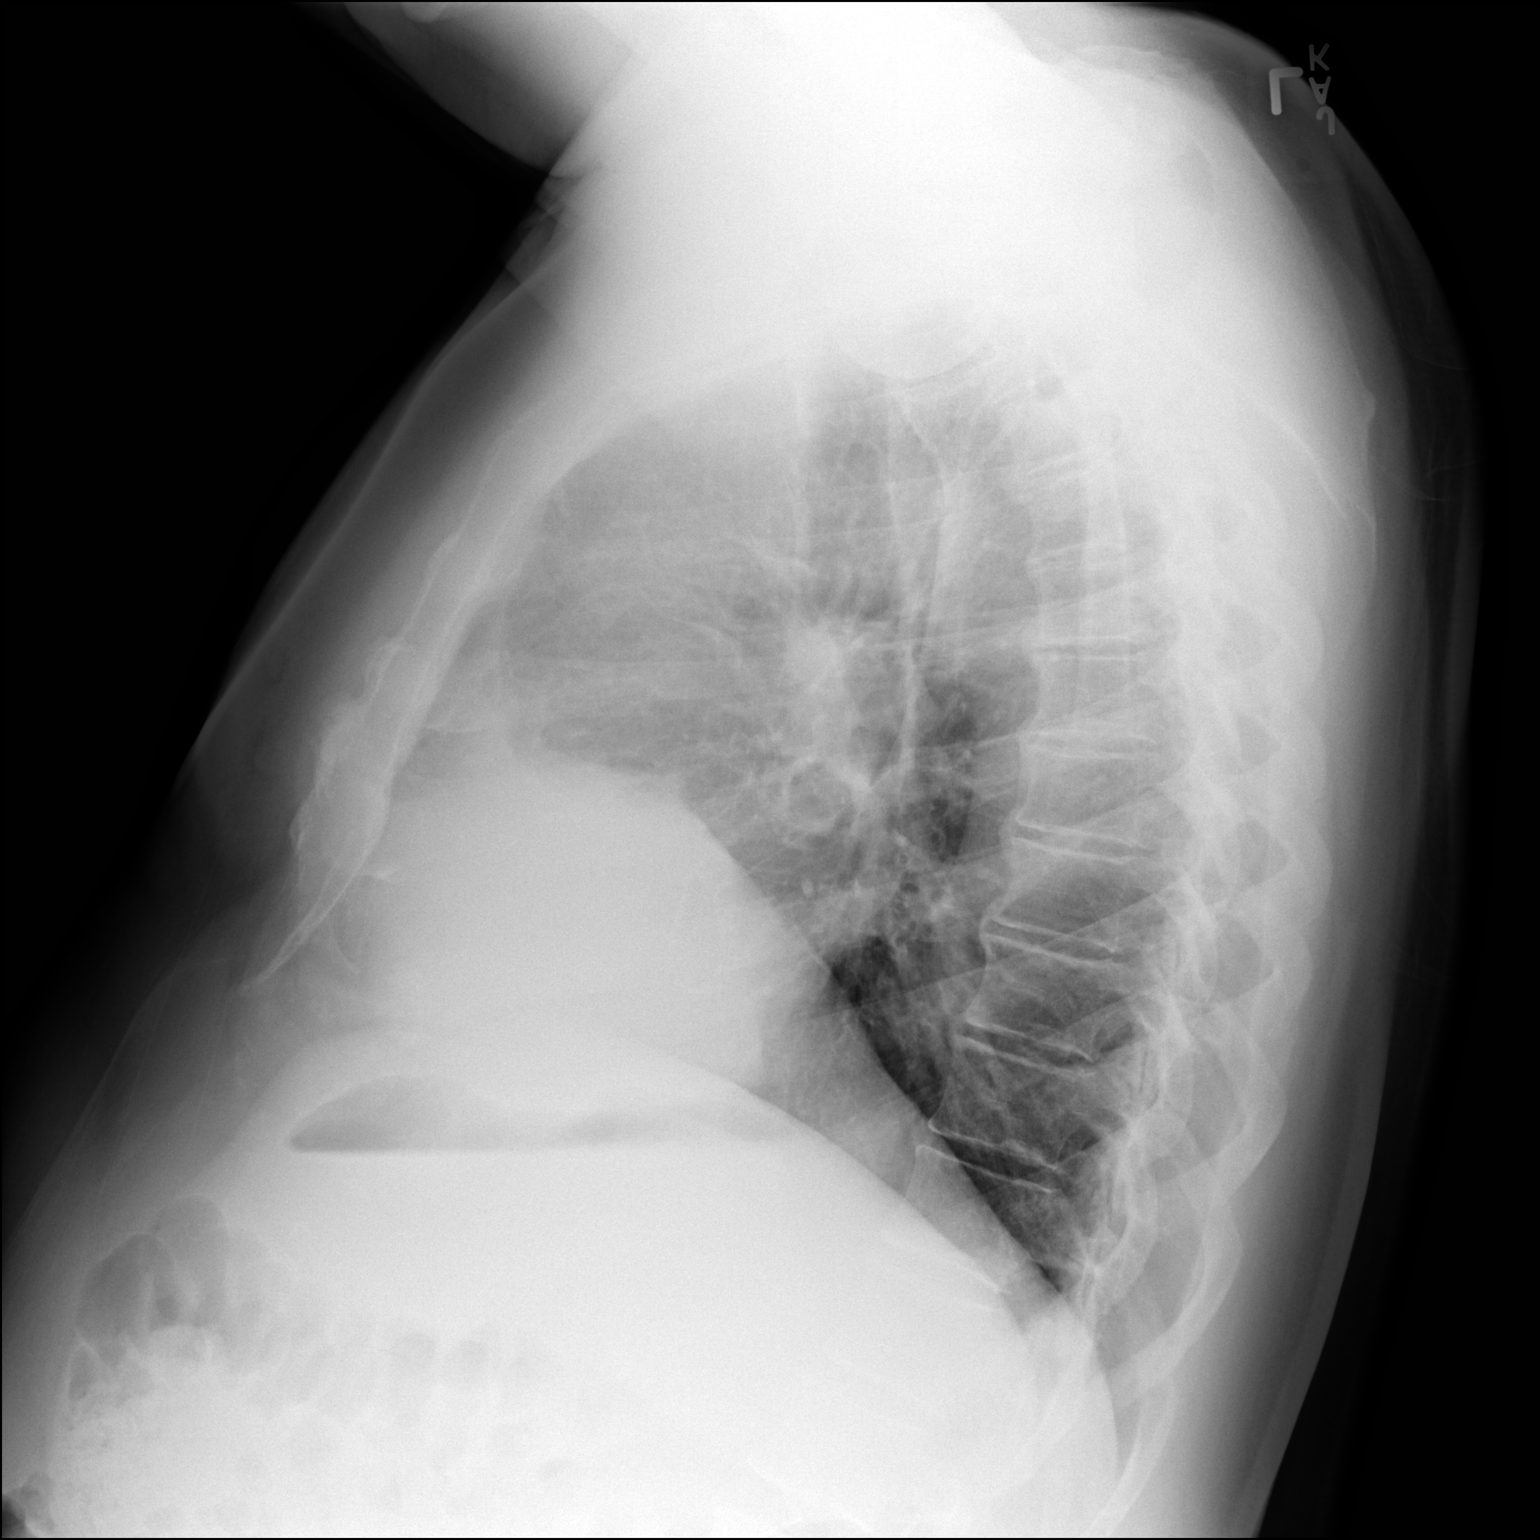

[2 of 2 positions shown; findings below may reference images not displayed]

FINDINGS: Unchanged elevation of the anterior right diaphragm. Clearing
atelectatic type opacities on the right. Pneumomediastinum or
pneumothorax and chest wall emphysema seen previously is no longer
visualized. Stable heart size and mediastinal contours.
IMPRESSION: Persistent elevation of the right diaphragm. Decreased atelectasis
and resolved anterior pneumothorax.

## 2018-03-31 DIAGNOSIS — Z79899 Other long term (current) drug therapy: Secondary | ICD-10-CM | POA: Diagnosis not present

## 2018-03-31 DIAGNOSIS — I10 Essential (primary) hypertension: Secondary | ICD-10-CM | POA: Diagnosis not present

## 2018-03-31 DIAGNOSIS — H9193 Unspecified hearing loss, bilateral: Secondary | ICD-10-CM | POA: Diagnosis not present

## 2018-06-11 ENCOUNTER — Other Ambulatory Visit: Payer: Self-pay

## 2018-08-05 DIAGNOSIS — E785 Hyperlipidemia, unspecified: Secondary | ICD-10-CM | POA: Diagnosis not present

## 2018-08-05 DIAGNOSIS — Z79899 Other long term (current) drug therapy: Secondary | ICD-10-CM | POA: Diagnosis not present

## 2018-08-05 DIAGNOSIS — R7309 Other abnormal glucose: Secondary | ICD-10-CM | POA: Diagnosis not present

## 2018-08-11 ENCOUNTER — Encounter: Payer: PPO | Admitting: Thoracic Surgery (Cardiothoracic Vascular Surgery)

## 2018-09-23 DIAGNOSIS — E781 Pure hyperglyceridemia: Secondary | ICD-10-CM | POA: Diagnosis not present

## 2018-09-23 DIAGNOSIS — R748 Abnormal levels of other serum enzymes: Secondary | ICD-10-CM | POA: Diagnosis not present

## 2018-09-23 DIAGNOSIS — Z6838 Body mass index (BMI) 38.0-38.9, adult: Secondary | ICD-10-CM | POA: Diagnosis not present

## 2018-09-23 DIAGNOSIS — I10 Essential (primary) hypertension: Secondary | ICD-10-CM | POA: Diagnosis not present

## 2018-09-23 DIAGNOSIS — I340 Nonrheumatic mitral (valve) insufficiency: Secondary | ICD-10-CM | POA: Diagnosis not present

## 2018-09-23 DIAGNOSIS — Z79899 Other long term (current) drug therapy: Secondary | ICD-10-CM | POA: Diagnosis not present

## 2019-01-05 DIAGNOSIS — E785 Hyperlipidemia, unspecified: Secondary | ICD-10-CM | POA: Diagnosis not present

## 2019-01-05 DIAGNOSIS — I10 Essential (primary) hypertension: Secondary | ICD-10-CM | POA: Diagnosis not present

## 2019-01-05 DIAGNOSIS — Z79899 Other long term (current) drug therapy: Secondary | ICD-10-CM | POA: Diagnosis not present

## 2019-01-05 DIAGNOSIS — Z6839 Body mass index (BMI) 39.0-39.9, adult: Secondary | ICD-10-CM | POA: Diagnosis not present

## 2019-01-05 DIAGNOSIS — R7309 Other abnormal glucose: Secondary | ICD-10-CM | POA: Diagnosis not present

## 2019-04-01 DIAGNOSIS — Z125 Encounter for screening for malignant neoplasm of prostate: Secondary | ICD-10-CM | POA: Diagnosis not present

## 2019-04-01 DIAGNOSIS — Z1159 Encounter for screening for other viral diseases: Secondary | ICD-10-CM | POA: Diagnosis not present

## 2019-04-01 DIAGNOSIS — Z1339 Encounter for screening examination for other mental health and behavioral disorders: Secondary | ICD-10-CM | POA: Diagnosis not present

## 2019-04-01 DIAGNOSIS — Z23 Encounter for immunization: Secondary | ICD-10-CM | POA: Diagnosis not present

## 2019-04-01 DIAGNOSIS — Z7901 Long term (current) use of anticoagulants: Secondary | ICD-10-CM | POA: Diagnosis not present

## 2019-04-01 DIAGNOSIS — I10 Essential (primary) hypertension: Secondary | ICD-10-CM | POA: Diagnosis not present

## 2019-04-01 DIAGNOSIS — N200 Calculus of kidney: Secondary | ICD-10-CM | POA: Diagnosis not present

## 2019-04-01 DIAGNOSIS — R7301 Impaired fasting glucose: Secondary | ICD-10-CM | POA: Diagnosis not present

## 2019-04-01 DIAGNOSIS — Z79899 Other long term (current) drug therapy: Secondary | ICD-10-CM | POA: Diagnosis not present

## 2019-04-01 DIAGNOSIS — Z1331 Encounter for screening for depression: Secondary | ICD-10-CM | POA: Diagnosis not present

## 2019-04-01 DIAGNOSIS — Z6839 Body mass index (BMI) 39.0-39.9, adult: Secondary | ICD-10-CM | POA: Diagnosis not present

## 2019-04-01 DIAGNOSIS — Z1231 Encounter for screening mammogram for malignant neoplasm of breast: Secondary | ICD-10-CM | POA: Diagnosis not present

## 2019-04-01 DIAGNOSIS — M5416 Radiculopathy, lumbar region: Secondary | ICD-10-CM | POA: Diagnosis not present

## 2019-04-01 DIAGNOSIS — E785 Hyperlipidemia, unspecified: Secondary | ICD-10-CM | POA: Diagnosis not present

## 2019-04-01 DIAGNOSIS — I2510 Atherosclerotic heart disease of native coronary artery without angina pectoris: Secondary | ICD-10-CM | POA: Diagnosis not present

## 2019-04-01 DIAGNOSIS — Z0000 Encounter for general adult medical examination without abnormal findings: Secondary | ICD-10-CM | POA: Diagnosis not present

## 2019-04-01 DIAGNOSIS — Z7189 Other specified counseling: Secondary | ICD-10-CM | POA: Diagnosis not present

## 2019-04-11 DIAGNOSIS — Z1211 Encounter for screening for malignant neoplasm of colon: Secondary | ICD-10-CM | POA: Diagnosis not present

## 2019-04-15 DIAGNOSIS — I10 Essential (primary) hypertension: Secondary | ICD-10-CM | POA: Diagnosis not present

## 2019-04-15 DIAGNOSIS — E875 Hyperkalemia: Secondary | ICD-10-CM | POA: Diagnosis not present

## 2019-05-21 DIAGNOSIS — Z6841 Body Mass Index (BMI) 40.0 and over, adult: Secondary | ICD-10-CM | POA: Diagnosis not present

## 2019-05-21 DIAGNOSIS — I10 Essential (primary) hypertension: Secondary | ICD-10-CM | POA: Diagnosis not present

## 2019-05-21 DIAGNOSIS — Z79899 Other long term (current) drug therapy: Secondary | ICD-10-CM | POA: Diagnosis not present

## 2019-07-24 ENCOUNTER — Other Ambulatory Visit: Payer: Self-pay

## 2019-08-06 DIAGNOSIS — Z79899 Other long term (current) drug therapy: Secondary | ICD-10-CM | POA: Diagnosis not present

## 2019-08-06 DIAGNOSIS — R7301 Impaired fasting glucose: Secondary | ICD-10-CM | POA: Diagnosis not present

## 2019-08-06 DIAGNOSIS — Z6839 Body mass index (BMI) 39.0-39.9, adult: Secondary | ICD-10-CM | POA: Diagnosis not present

## 2019-08-06 DIAGNOSIS — I10 Essential (primary) hypertension: Secondary | ICD-10-CM | POA: Diagnosis not present

## 2019-08-06 DIAGNOSIS — E785 Hyperlipidemia, unspecified: Secondary | ICD-10-CM | POA: Diagnosis not present

## 2019-08-27 DIAGNOSIS — I10 Essential (primary) hypertension: Secondary | ICD-10-CM | POA: Diagnosis not present

## 2019-08-27 DIAGNOSIS — R109 Unspecified abdominal pain: Secondary | ICD-10-CM | POA: Diagnosis not present

## 2019-08-27 DIAGNOSIS — Z6839 Body mass index (BMI) 39.0-39.9, adult: Secondary | ICD-10-CM | POA: Diagnosis not present

## 2019-08-27 DIAGNOSIS — R351 Nocturia: Secondary | ICD-10-CM | POA: Diagnosis not present

## 2019-08-27 DIAGNOSIS — Z1331 Encounter for screening for depression: Secondary | ICD-10-CM | POA: Diagnosis not present

## 2019-08-28 DIAGNOSIS — R1013 Epigastric pain: Secondary | ICD-10-CM | POA: Diagnosis not present

## 2019-09-25 DIAGNOSIS — R1084 Generalized abdominal pain: Secondary | ICD-10-CM | POA: Diagnosis not present

## 2019-09-25 DIAGNOSIS — R7989 Other specified abnormal findings of blood chemistry: Secondary | ICD-10-CM | POA: Diagnosis not present

## 2019-09-25 DIAGNOSIS — R109 Unspecified abdominal pain: Secondary | ICD-10-CM | POA: Diagnosis not present

## 2019-10-20 DIAGNOSIS — Z6839 Body mass index (BMI) 39.0-39.9, adult: Secondary | ICD-10-CM | POA: Diagnosis not present

## 2019-10-20 DIAGNOSIS — E785 Hyperlipidemia, unspecified: Secondary | ICD-10-CM | POA: Diagnosis not present

## 2019-10-20 DIAGNOSIS — Z1331 Encounter for screening for depression: Secondary | ICD-10-CM | POA: Diagnosis not present

## 2019-10-20 DIAGNOSIS — I10 Essential (primary) hypertension: Secondary | ICD-10-CM | POA: Diagnosis not present

## 2020-04-21 DIAGNOSIS — Z6839 Body mass index (BMI) 39.0-39.9, adult: Secondary | ICD-10-CM | POA: Diagnosis not present

## 2020-04-21 DIAGNOSIS — Z1331 Encounter for screening for depression: Secondary | ICD-10-CM | POA: Diagnosis not present

## 2020-04-21 DIAGNOSIS — R7301 Impaired fasting glucose: Secondary | ICD-10-CM | POA: Diagnosis not present

## 2020-04-21 DIAGNOSIS — Z7189 Other specified counseling: Secondary | ICD-10-CM | POA: Diagnosis not present

## 2020-04-21 DIAGNOSIS — Z79899 Other long term (current) drug therapy: Secondary | ICD-10-CM | POA: Diagnosis not present

## 2020-04-21 DIAGNOSIS — I10 Essential (primary) hypertension: Secondary | ICD-10-CM | POA: Diagnosis not present

## 2020-04-21 DIAGNOSIS — K219 Gastro-esophageal reflux disease without esophagitis: Secondary | ICD-10-CM | POA: Diagnosis not present

## 2020-04-21 DIAGNOSIS — E785 Hyperlipidemia, unspecified: Secondary | ICD-10-CM | POA: Diagnosis not present

## 2020-04-21 DIAGNOSIS — Z125 Encounter for screening for malignant neoplasm of prostate: Secondary | ICD-10-CM | POA: Diagnosis not present

## 2020-07-26 DIAGNOSIS — I10 Essential (primary) hypertension: Secondary | ICD-10-CM | POA: Diagnosis not present

## 2020-07-26 DIAGNOSIS — E1169 Type 2 diabetes mellitus with other specified complication: Secondary | ICD-10-CM | POA: Diagnosis not present

## 2020-07-26 DIAGNOSIS — K219 Gastro-esophageal reflux disease without esophagitis: Secondary | ICD-10-CM | POA: Diagnosis not present

## 2020-07-26 DIAGNOSIS — E785 Hyperlipidemia, unspecified: Secondary | ICD-10-CM | POA: Diagnosis not present

## 2020-07-26 DIAGNOSIS — Z79899 Other long term (current) drug therapy: Secondary | ICD-10-CM | POA: Diagnosis not present

## 2020-08-22 DIAGNOSIS — R7989 Other specified abnormal findings of blood chemistry: Secondary | ICD-10-CM | POA: Diagnosis not present

## 2020-09-29 DIAGNOSIS — Z1211 Encounter for screening for malignant neoplasm of colon: Secondary | ICD-10-CM | POA: Diagnosis not present

## 2020-10-26 DIAGNOSIS — Z1331 Encounter for screening for depression: Secondary | ICD-10-CM | POA: Diagnosis not present

## 2020-10-26 DIAGNOSIS — I10 Essential (primary) hypertension: Secondary | ICD-10-CM | POA: Diagnosis not present

## 2020-10-26 DIAGNOSIS — Z1339 Encounter for screening examination for other mental health and behavioral disorders: Secondary | ICD-10-CM | POA: Diagnosis not present

## 2020-10-26 DIAGNOSIS — Z6837 Body mass index (BMI) 37.0-37.9, adult: Secondary | ICD-10-CM | POA: Diagnosis not present

## 2020-10-26 DIAGNOSIS — E785 Hyperlipidemia, unspecified: Secondary | ICD-10-CM | POA: Diagnosis not present

## 2020-10-26 DIAGNOSIS — Z0000 Encounter for general adult medical examination without abnormal findings: Secondary | ICD-10-CM | POA: Diagnosis not present

## 2020-10-26 DIAGNOSIS — E1169 Type 2 diabetes mellitus with other specified complication: Secondary | ICD-10-CM | POA: Diagnosis not present

## 2020-10-26 DIAGNOSIS — Z79899 Other long term (current) drug therapy: Secondary | ICD-10-CM | POA: Diagnosis not present

## 2021-04-25 DIAGNOSIS — I10 Essential (primary) hypertension: Secondary | ICD-10-CM | POA: Diagnosis not present

## 2021-04-25 DIAGNOSIS — Z79899 Other long term (current) drug therapy: Secondary | ICD-10-CM | POA: Diagnosis not present

## 2021-04-25 DIAGNOSIS — Z6838 Body mass index (BMI) 38.0-38.9, adult: Secondary | ICD-10-CM | POA: Diagnosis not present

## 2021-04-25 DIAGNOSIS — Z125 Encounter for screening for malignant neoplasm of prostate: Secondary | ICD-10-CM | POA: Diagnosis not present

## 2021-04-25 DIAGNOSIS — E785 Hyperlipidemia, unspecified: Secondary | ICD-10-CM | POA: Diagnosis not present

## 2021-04-25 DIAGNOSIS — E1169 Type 2 diabetes mellitus with other specified complication: Secondary | ICD-10-CM | POA: Diagnosis not present

## 2021-09-18 DIAGNOSIS — E1169 Type 2 diabetes mellitus with other specified complication: Secondary | ICD-10-CM | POA: Diagnosis not present

## 2021-11-02 DIAGNOSIS — Z6836 Body mass index (BMI) 36.0-36.9, adult: Secondary | ICD-10-CM | POA: Diagnosis not present

## 2021-11-02 DIAGNOSIS — Z79899 Other long term (current) drug therapy: Secondary | ICD-10-CM | POA: Diagnosis not present

## 2021-11-02 DIAGNOSIS — I10 Essential (primary) hypertension: Secondary | ICD-10-CM | POA: Diagnosis not present

## 2021-11-02 DIAGNOSIS — Z1331 Encounter for screening for depression: Secondary | ICD-10-CM | POA: Diagnosis not present

## 2021-11-02 DIAGNOSIS — E785 Hyperlipidemia, unspecified: Secondary | ICD-10-CM | POA: Diagnosis not present

## 2021-11-02 DIAGNOSIS — K219 Gastro-esophageal reflux disease without esophagitis: Secondary | ICD-10-CM | POA: Diagnosis not present

## 2021-11-02 DIAGNOSIS — Z1211 Encounter for screening for malignant neoplasm of colon: Secondary | ICD-10-CM | POA: Diagnosis not present

## 2021-11-02 DIAGNOSIS — E1169 Type 2 diabetes mellitus with other specified complication: Secondary | ICD-10-CM | POA: Diagnosis not present

## 2021-11-02 DIAGNOSIS — Z0000 Encounter for general adult medical examination without abnormal findings: Secondary | ICD-10-CM | POA: Diagnosis not present

## 2021-12-14 DIAGNOSIS — Z131 Encounter for screening for diabetes mellitus: Secondary | ICD-10-CM | POA: Diagnosis not present

## 2021-12-14 DIAGNOSIS — Z1212 Encounter for screening for malignant neoplasm of rectum: Secondary | ICD-10-CM | POA: Diagnosis not present

## 2022-01-16 DIAGNOSIS — H43823 Vitreomacular adhesion, bilateral: Secondary | ICD-10-CM | POA: Diagnosis not present

## 2022-01-16 DIAGNOSIS — H25813 Combined forms of age-related cataract, bilateral: Secondary | ICD-10-CM | POA: Diagnosis not present

## 2022-01-16 DIAGNOSIS — E119 Type 2 diabetes mellitus without complications: Secondary | ICD-10-CM | POA: Diagnosis not present

## 2022-06-11 DIAGNOSIS — E1169 Type 2 diabetes mellitus with other specified complication: Secondary | ICD-10-CM | POA: Diagnosis not present

## 2022-06-11 DIAGNOSIS — K219 Gastro-esophageal reflux disease without esophagitis: Secondary | ICD-10-CM | POA: Diagnosis not present

## 2022-06-11 DIAGNOSIS — Z6835 Body mass index (BMI) 35.0-35.9, adult: Secondary | ICD-10-CM | POA: Diagnosis not present

## 2022-06-11 DIAGNOSIS — Z79899 Other long term (current) drug therapy: Secondary | ICD-10-CM | POA: Diagnosis not present

## 2022-06-11 DIAGNOSIS — Z125 Encounter for screening for malignant neoplasm of prostate: Secondary | ICD-10-CM | POA: Diagnosis not present

## 2022-06-11 DIAGNOSIS — I10 Essential (primary) hypertension: Secondary | ICD-10-CM | POA: Diagnosis not present

## 2022-06-11 DIAGNOSIS — E785 Hyperlipidemia, unspecified: Secondary | ICD-10-CM | POA: Diagnosis not present

## 2022-11-05 DIAGNOSIS — Z79899 Other long term (current) drug therapy: Secondary | ICD-10-CM | POA: Diagnosis not present

## 2022-11-05 DIAGNOSIS — H6121 Impacted cerumen, right ear: Secondary | ICD-10-CM | POA: Diagnosis not present

## 2022-11-05 DIAGNOSIS — E785 Hyperlipidemia, unspecified: Secondary | ICD-10-CM | POA: Diagnosis not present

## 2022-11-05 DIAGNOSIS — Z0000 Encounter for general adult medical examination without abnormal findings: Secondary | ICD-10-CM | POA: Diagnosis not present

## 2022-11-05 DIAGNOSIS — I10 Essential (primary) hypertension: Secondary | ICD-10-CM | POA: Diagnosis not present

## 2022-11-05 DIAGNOSIS — Z6836 Body mass index (BMI) 36.0-36.9, adult: Secondary | ICD-10-CM | POA: Diagnosis not present

## 2022-11-05 DIAGNOSIS — Z125 Encounter for screening for malignant neoplasm of prostate: Secondary | ICD-10-CM | POA: Diagnosis not present

## 2022-11-05 DIAGNOSIS — Z1331 Encounter for screening for depression: Secondary | ICD-10-CM | POA: Diagnosis not present

## 2022-11-05 DIAGNOSIS — E1169 Type 2 diabetes mellitus with other specified complication: Secondary | ICD-10-CM | POA: Diagnosis not present

## 2022-11-05 DIAGNOSIS — Z1211 Encounter for screening for malignant neoplasm of colon: Secondary | ICD-10-CM | POA: Diagnosis not present

## 2022-11-28 DIAGNOSIS — Z1211 Encounter for screening for malignant neoplasm of colon: Secondary | ICD-10-CM | POA: Diagnosis not present

## 2023-06-25 DIAGNOSIS — I10 Essential (primary) hypertension: Secondary | ICD-10-CM | POA: Diagnosis not present

## 2023-06-25 DIAGNOSIS — E785 Hyperlipidemia, unspecified: Secondary | ICD-10-CM | POA: Diagnosis not present

## 2023-06-25 DIAGNOSIS — E1169 Type 2 diabetes mellitus with other specified complication: Secondary | ICD-10-CM | POA: Diagnosis not present

## 2023-06-25 DIAGNOSIS — K219 Gastro-esophageal reflux disease without esophagitis: Secondary | ICD-10-CM | POA: Diagnosis not present

## 2023-06-25 DIAGNOSIS — Z6835 Body mass index (BMI) 35.0-35.9, adult: Secondary | ICD-10-CM | POA: Diagnosis not present

## 2023-06-25 DIAGNOSIS — I340 Nonrheumatic mitral (valve) insufficiency: Secondary | ICD-10-CM | POA: Diagnosis not present

## 2023-06-25 DIAGNOSIS — R5383 Other fatigue: Secondary | ICD-10-CM | POA: Diagnosis not present

## 2023-07-01 DIAGNOSIS — E785 Hyperlipidemia, unspecified: Secondary | ICD-10-CM | POA: Diagnosis not present

## 2023-07-01 DIAGNOSIS — E1169 Type 2 diabetes mellitus with other specified complication: Secondary | ICD-10-CM | POA: Diagnosis not present

## 2023-07-01 DIAGNOSIS — Z79899 Other long term (current) drug therapy: Secondary | ICD-10-CM | POA: Diagnosis not present

## 2023-07-01 DIAGNOSIS — I10 Essential (primary) hypertension: Secondary | ICD-10-CM | POA: Diagnosis not present

## 2023-07-04 DIAGNOSIS — I10 Essential (primary) hypertension: Secondary | ICD-10-CM | POA: Diagnosis not present

## 2023-07-04 DIAGNOSIS — I340 Nonrheumatic mitral (valve) insufficiency: Secondary | ICD-10-CM | POA: Diagnosis not present

## 2023-07-24 ENCOUNTER — Encounter: Payer: Self-pay | Admitting: Internal Medicine

## 2023-11-11 DIAGNOSIS — Z1211 Encounter for screening for malignant neoplasm of colon: Secondary | ICD-10-CM | POA: Diagnosis not present

## 2023-11-12 DIAGNOSIS — I10 Essential (primary) hypertension: Secondary | ICD-10-CM | POA: Diagnosis not present

## 2023-11-12 DIAGNOSIS — Z1211 Encounter for screening for malignant neoplasm of colon: Secondary | ICD-10-CM | POA: Diagnosis not present

## 2023-11-12 DIAGNOSIS — M7071 Other bursitis of hip, right hip: Secondary | ICD-10-CM | POA: Diagnosis not present

## 2023-11-12 DIAGNOSIS — E1169 Type 2 diabetes mellitus with other specified complication: Secondary | ICD-10-CM | POA: Diagnosis not present

## 2023-11-12 DIAGNOSIS — Z125 Encounter for screening for malignant neoplasm of prostate: Secondary | ICD-10-CM | POA: Diagnosis not present

## 2023-11-12 DIAGNOSIS — E785 Hyperlipidemia, unspecified: Secondary | ICD-10-CM | POA: Diagnosis not present

## 2023-11-12 DIAGNOSIS — Z79899 Other long term (current) drug therapy: Secondary | ICD-10-CM | POA: Diagnosis not present

## 2023-11-12 DIAGNOSIS — Z0000 Encounter for general adult medical examination without abnormal findings: Secondary | ICD-10-CM | POA: Diagnosis not present

## 2023-11-12 DIAGNOSIS — Z1331 Encounter for screening for depression: Secondary | ICD-10-CM | POA: Diagnosis not present

## 2024-02-10 DIAGNOSIS — Z905 Acquired absence of kidney: Secondary | ICD-10-CM | POA: Diagnosis not present

## 2024-02-10 DIAGNOSIS — R195 Other fecal abnormalities: Secondary | ICD-10-CM | POA: Diagnosis not present

## 2024-03-24 DIAGNOSIS — Z1211 Encounter for screening for malignant neoplasm of colon: Secondary | ICD-10-CM | POA: Diagnosis not present

## 2024-03-24 DIAGNOSIS — K635 Polyp of colon: Secondary | ICD-10-CM | POA: Diagnosis not present

## 2024-03-24 DIAGNOSIS — I10 Essential (primary) hypertension: Secondary | ICD-10-CM | POA: Diagnosis not present

## 2024-03-24 DIAGNOSIS — D126 Benign neoplasm of colon, unspecified: Secondary | ICD-10-CM | POA: Diagnosis not present

## 2024-03-24 DIAGNOSIS — K644 Residual hemorrhoidal skin tags: Secondary | ICD-10-CM | POA: Diagnosis not present

## 2024-03-24 DIAGNOSIS — E119 Type 2 diabetes mellitus without complications: Secondary | ICD-10-CM | POA: Diagnosis not present

## 2024-03-24 DIAGNOSIS — R195 Other fecal abnormalities: Secondary | ICD-10-CM | POA: Diagnosis not present

## 2024-03-24 DIAGNOSIS — D122 Benign neoplasm of ascending colon: Secondary | ICD-10-CM | POA: Diagnosis not present

## 2024-03-24 DIAGNOSIS — K5730 Diverticulosis of large intestine without perforation or abscess without bleeding: Secondary | ICD-10-CM | POA: Diagnosis not present

## 2024-05-11 DIAGNOSIS — I10 Essential (primary) hypertension: Secondary | ICD-10-CM | POA: Diagnosis not present

## 2024-05-11 DIAGNOSIS — K219 Gastro-esophageal reflux disease without esophagitis: Secondary | ICD-10-CM | POA: Diagnosis not present

## 2024-05-11 DIAGNOSIS — Z6835 Body mass index (BMI) 35.0-35.9, adult: Secondary | ICD-10-CM | POA: Diagnosis not present

## 2024-05-11 DIAGNOSIS — E1169 Type 2 diabetes mellitus with other specified complication: Secondary | ICD-10-CM | POA: Diagnosis not present

## 2024-05-11 DIAGNOSIS — Z79899 Other long term (current) drug therapy: Secondary | ICD-10-CM | POA: Diagnosis not present

## 2024-05-11 DIAGNOSIS — E785 Hyperlipidemia, unspecified: Secondary | ICD-10-CM | POA: Diagnosis not present
